# Patient Record
Sex: Female | Born: 1975 | Race: Black or African American | Hispanic: No | Marital: Married | Smoking: Never smoker
Health system: Southern US, Community
[De-identification: ages and names within clinical notes are randomized; demographics above are authoritative.]

## PROBLEM LIST (undated history)

## (undated) DIAGNOSIS — R51 Headache: Secondary | ICD-10-CM

## (undated) DIAGNOSIS — R519 Headache, unspecified: Secondary | ICD-10-CM

## (undated) DIAGNOSIS — J45909 Unspecified asthma, uncomplicated: Secondary | ICD-10-CM

## (undated) DIAGNOSIS — I10 Essential (primary) hypertension: Secondary | ICD-10-CM

## (undated) DIAGNOSIS — T7840XA Allergy, unspecified, initial encounter: Secondary | ICD-10-CM

## (undated) DIAGNOSIS — U071 COVID-19: Secondary | ICD-10-CM

## (undated) DIAGNOSIS — Z9289 Personal history of other medical treatment: Secondary | ICD-10-CM

## (undated) DIAGNOSIS — F32A Depression, unspecified: Secondary | ICD-10-CM

## (undated) DIAGNOSIS — G4730 Sleep apnea, unspecified: Secondary | ICD-10-CM

## (undated) DIAGNOSIS — B019 Varicella without complication: Secondary | ICD-10-CM

## (undated) DIAGNOSIS — E119 Type 2 diabetes mellitus without complications: Secondary | ICD-10-CM

## (undated) DIAGNOSIS — F329 Major depressive disorder, single episode, unspecified: Secondary | ICD-10-CM

## (undated) HISTORY — DX: Varicella without complication: B01.9

## (undated) HISTORY — PX: SEPTOPLASTY: SUR1290

## (undated) HISTORY — PX: WISDOM TOOTH EXTRACTION: SHX21

## (undated) HISTORY — DX: Unspecified asthma, uncomplicated: J45.909

## (undated) HISTORY — DX: Type 2 diabetes mellitus without complications: E11.9

## (undated) HISTORY — DX: Allergy, unspecified, initial encounter: T78.40XA

## (undated) HISTORY — DX: Headache: R51

## (undated) HISTORY — DX: Headache, unspecified: R51.9

---

## 2002-07-19 HISTORY — PX: BREAST BIOPSY: SHX20

## 2009-05-09 ENCOUNTER — Encounter: Admission: RE | Admit: 2009-05-09 | Discharge: 2009-05-09 | Payer: Self-pay | Admitting: Otolaryngology

## 2009-06-25 ENCOUNTER — Encounter: Admission: RE | Admit: 2009-06-25 | Discharge: 2009-07-16 | Payer: Self-pay | Admitting: Obstetrics and Gynecology

## 2009-07-14 ENCOUNTER — Inpatient Hospital Stay (HOSPITAL_COMMUNITY): Admission: AD | Admit: 2009-07-14 | Discharge: 2009-07-25 | Payer: Self-pay | Admitting: Obstetrics and Gynecology

## 2009-07-15 ENCOUNTER — Other Ambulatory Visit: Payer: Self-pay | Admitting: Obstetrics and Gynecology

## 2009-07-18 ENCOUNTER — Encounter (INDEPENDENT_AMBULATORY_CARE_PROVIDER_SITE_OTHER): Payer: Self-pay | Admitting: Obstetrics and Gynecology

## 2009-07-19 DIAGNOSIS — Z9289 Personal history of other medical treatment: Secondary | ICD-10-CM

## 2009-07-19 HISTORY — DX: Personal history of other medical treatment: Z92.89

## 2009-07-25 ENCOUNTER — Inpatient Hospital Stay (HOSPITAL_COMMUNITY): Admission: AD | Admit: 2009-07-25 | Discharge: 2009-07-25 | Payer: Self-pay | Admitting: Obstetrics and Gynecology

## 2010-07-03 ENCOUNTER — Ambulatory Visit: Payer: Self-pay | Admitting: Family Medicine

## 2010-07-22 DIAGNOSIS — J309 Allergic rhinitis, unspecified: Secondary | ICD-10-CM | POA: Insufficient documentation

## 2010-07-22 DIAGNOSIS — N3941 Urge incontinence: Secondary | ICD-10-CM | POA: Insufficient documentation

## 2010-07-22 DIAGNOSIS — R51 Headache: Secondary | ICD-10-CM

## 2010-07-22 DIAGNOSIS — R519 Headache, unspecified: Secondary | ICD-10-CM | POA: Insufficient documentation

## 2010-07-22 DIAGNOSIS — J45909 Unspecified asthma, uncomplicated: Secondary | ICD-10-CM | POA: Insufficient documentation

## 2010-07-22 LAB — CONVERTED CEMR LAB
Bilirubin Urine: NEGATIVE
Blood in Urine, dipstick: NEGATIVE
Glucose, Urine, Semiquant: NEGATIVE
Ketones, urine, test strip: NEGATIVE
Nitrite: NEGATIVE
Protein, U semiquant: 30
Specific Gravity, Urine: 1.025
Urobilinogen, UA: 0.2
WBC Urine, dipstick: NEGATIVE
pH: 5

## 2010-08-09 ENCOUNTER — Encounter: Payer: Self-pay | Admitting: Obstetrics and Gynecology

## 2010-08-19 ENCOUNTER — Encounter: Payer: Self-pay | Admitting: Family Medicine

## 2010-08-20 NOTE — Assessment & Plan Note (Signed)
Summary: NEW PT TO EST/CLE   Vital Signs:  Patient profile:   35 year old female Height:      63.25 inches Weight:      194 pounds BMI:     34.22 Temp:     98.0 degrees F oral Pulse rate:   67 / minute Pulse rhythm:   regular BP sitting:   130 / 90  (left arm) Cuff size:   regular  Vitals Entered By: Linde Gillis CMA (AAMA) (July 22, 2010 10:00 AM) CC: new patient, establish care   History of Present Illness: 35 yo here to [place] care.  1.  Urge incontinence- has been ongoing for over a year, getting gradually worse.  Started even before her pregnancy with her 36 year old.  She is a G1P1 s/p C/s.  Gets up several times per night but often cannot make it to the bathroom in middle of day either. No dysuria or hematuria. Not worsened by coughing, sneezing, laughing. no pain with intercourse.  2.  Migraine headaches- has had sinus issues for years.  Followed by ENT at [facility].  She had Septoplasty and scope done recently and told her sinuses are now clear.  Still having headaches.  Usually unilateral.  Associated with nausea and photophobia.  Having 5-6 per month.  Wants to go in dark room when they occur.  Preventive Screening-Counseling & Management  Alcohol-Tobacco     Smoking Status: never      Drug Use:  no.    Current Medications (verified): 1)  Butalbital-Apap-Caffeine 50-325-40 Mg Tabs (Butalbital-Apap-Caffeine) .... Take One Tablet By Mouth Daily 2)  Topiramate 25 Mg Tabs (Topiramate) .Marland Kitchen.. 1 Tab By Mouth Qhs  Allergies (verified): No Known Drug Allergies  Past History:  Past Medical History: Allergic rhinitis Asthma G1P1- Dr. Cherly Hensen is OBGYN urinary incontinence OSA  Past Surgical History: Septoplasty Dec 24, 2004  Family History: Dad died at 57 of Gulf War Syndrome Family History of CAD Female 1st degree relative <60- gets yearly mammograms, [facility]. Dr. Cherly Hensen is OBGYN.    Social History: Married One daughter, 37 yo step son. Married.    Never Smoked Alcohol use-yes Drug use-no Smoking Status:  never Drug Use:  no  Review of Systems      See HPI General:  Denies malaise. Eyes:  Denies blurring. ENT:  Denies difficulty swallowing. CV:  Denies chest pain or discomfort. Resp:  Denies shortness of breath. GI:  Denies abdominal pain, bloody stools, and change in bowel habits. GU:  Complains of incontinence; denies dysuria, urinary frequency, and urinary hesitancy. MS:  Denies joint pain, joint redness, and joint swelling. Derm:  Denies rash. Neuro:  Complains of headaches; denies sensation of room spinning, tingling, tremors, visual disturbances, and weakness. Psych:  Denies anxiety and depression. Endo:  Denies cold intolerance and heat intolerance. Heme:  Denies abnormal bruising and bleeding. Allergy:  Complains of seasonal allergies and sneezing.  Physical Exam  General:  alert and overweight-appearing.   Head:  normocephalic and atraumatic.   Eyes:  vision grossly intact, pupils equal, and pupils round.   Ears:  R ear normal and L ear normal.   Nose:  no external deformity.   Mouth:  good dentition.   Neck:  No deformities, masses, or tenderness noted. Lungs:  Normal respiratory effort, chest expands symmetrically. Lungs are clear to auscultation, no crackles or wheezes. Heart:  Normal rate and regular rhythm. S1 and S2 normal without gallop, murmur, click, rub or other extra sounds. Abdomen:  Bowel sounds positive,abdomen soft and non-tender without masses, organomegaly or hernias noted. Msk:  No deformity or scoliosis noted of thoracic or lumbar spine.   Extremities:  no edema Neurologic:  alert & oriented X3, gait normal, and DTRs symmetrical and normal.   Skin:  Intact without suspicious lesions or rashes Psych:  Cognition and judgment appear intact. Alert and cooperative with normal attention span and concentration. No apparent delusions, illusions, hallucinations   Impression &  Recommendations:  Problem # 1:  URINARY INCONTINENCE, URGE (ICD-788.31) Assessment New UA pos for protein only. Given age and duration of symtpoms, will refer to urology for urodynamics. ?IC Orders: Urology Referral (Urology) UA Dipstick w/o Micro (manual) (09811)  Problem # 2:  MIGRAINE HEADACHE (ICD-346.90) Assessment: New Given frequency of headaches, will start Topamax for prophylaxis.  Pt information handout given. Will have pt follow up in 1-2 months. Her updated medication list for this problem includes:    Butalbital-apap-caffeine 50-325-40 Mg Tabs (Butalbital-apap-caffeine) .Marland Kitchen... Take one tablet by mouth daily  Complete Medication List: 1)  Butalbital-apap-caffeine 50-325-40 Mg Tabs (Butalbital-apap-caffeine) .... Take one tablet by mouth daily 2)  Topiramate 25 Mg Tabs (Topiramate) .Marland Kitchen.. 1 tab by mouth qhs  Patient Instructions: 1)  Great to meet you. 2)  Please stop by to see Shirlee Limerick on your way out. 3)  Please make an appt to come see me in 1-2 months (fasting so we can check labs). Prescriptions: TOPIRAMATE 25 MG TABS (TOPIRAMATE) 1 tab by mouth qhs  #30 x 2   Entered and Authorized by:   Ruthe Mannan MD   Signed by:   Ruthe Mannan MD on 07/22/2010   Method used:   Print then Give to Patient   RxID:   [id number]    Orders Added: 1)  Urology Referral [Urology] 2)  UA Dipstick w/o Micro (manual) [81002] 3)  New Patient Level IV [78469]    Current Allergies (reviewed today): No known allergies   Laboratory Results   Urine Tests  Date/Time Received: July 22, 2010 10:22 AM  Routine Urinalysis   Color: yellow Appearance: Clear Glucose: negative   (Normal Range: Negative) Bilirubin: negative   (Normal Range: Negative) Ketone: negative   (Normal Range: Negative) Spec. Gravity: 1.025   (Normal Range: 1.003-1.035) Blood: negative   (Normal Range: Negative) pH: 5.0   (Normal Range: 5.0-8.0) Protein: 30   (Normal Range: Negative) Urobilinogen: 0.2    (Normal Range: 0-1) Nitrite: negative   (Normal Range: Negative) Leukocyte Esterace: negative   (Normal Range: Negative)

## 2010-09-03 NOTE — Consult Note (Signed)
Summary: [facility]  [facility]   Imported By: Maryln Gottron 08/27/2010 15:46:32  _____________________________________________________________________  External Attachment:    Type:   Image     Comment:   External Document  Appended Document: [facility]- Toviaz and Pelvic floor PT prescribed.

## 2010-09-22 ENCOUNTER — Ambulatory Visit: Payer: Self-pay | Admitting: Family Medicine

## 2010-09-22 DIAGNOSIS — Z0289 Encounter for other administrative examinations: Secondary | ICD-10-CM

## 2010-10-04 LAB — CROSSMATCH
ABO/RH(D): B POS
Antibody Screen: NEGATIVE

## 2010-10-04 LAB — CBC
HCT: 18.9 % — ABNORMAL LOW (ref 36.0–46.0)
HCT: 19.6 % — ABNORMAL LOW (ref 36.0–46.0)
HCT: 26.3 % — ABNORMAL LOW (ref 36.0–46.0)
HCT: 28.1 % — ABNORMAL LOW (ref 36.0–46.0)
HCT: 30.7 % — ABNORMAL LOW (ref 36.0–46.0)
Hemoglobin: 10.3 g/dL — ABNORMAL LOW (ref 12.0–15.0)
Hemoglobin: 6.2 g/dL — CL (ref 12.0–15.0)
Hemoglobin: 6.5 g/dL — CL (ref 12.0–15.0)
Hemoglobin: 8.8 g/dL — ABNORMAL LOW (ref 12.0–15.0)
Hemoglobin: 9.3 g/dL — ABNORMAL LOW (ref 12.0–15.0)
MCHC: 32.7 g/dL (ref 30.0–36.0)
MCHC: 33 g/dL (ref 30.0–36.0)
MCHC: 33.1 g/dL (ref 30.0–36.0)
MCHC: 33.5 g/dL (ref 30.0–36.0)
MCHC: 33.5 g/dL (ref 30.0–36.0)
MCV: 84.5 fL (ref 78.0–100.0)
MCV: 84.7 fL (ref 78.0–100.0)
MCV: 84.8 fL (ref 78.0–100.0)
MCV: 85.9 fL (ref 78.0–100.0)
MCV: 86.7 fL (ref 78.0–100.0)
Platelets: 284 10*3/uL (ref 150–400)
Platelets: 318 10*3/uL (ref 150–400)
Platelets: 340 10*3/uL (ref 150–400)
Platelets: 367 10*3/uL (ref 150–400)
Platelets: 378 10*3/uL (ref 150–400)
RBC: 2.23 MIL/uL — ABNORMAL LOW (ref 3.87–5.11)
RBC: 2.31 MIL/uL — ABNORMAL LOW (ref 3.87–5.11)
RBC: 3.07 MIL/uL — ABNORMAL LOW (ref 3.87–5.11)
RBC: 3.32 MIL/uL — ABNORMAL LOW (ref 3.87–5.11)
RBC: 3.54 MIL/uL — ABNORMAL LOW (ref 3.87–5.11)
RDW: 13.7 % (ref 11.5–15.5)
RDW: 13.8 % (ref 11.5–15.5)
RDW: 14 % (ref 11.5–15.5)
RDW: 14.4 % (ref 11.5–15.5)
RDW: 14.8 % (ref 11.5–15.5)
WBC: 19.1 10*3/uL — ABNORMAL HIGH (ref 4.0–10.5)
WBC: 19.2 10*3/uL — ABNORMAL HIGH (ref 4.0–10.5)
WBC: 20 10*3/uL — ABNORMAL HIGH (ref 4.0–10.5)
WBC: 20.3 10*3/uL — ABNORMAL HIGH (ref 4.0–10.5)
WBC: 24.4 10*3/uL — ABNORMAL HIGH (ref 4.0–10.5)

## 2010-10-04 LAB — SAMPLE TO BLOOD BANK

## 2010-10-04 LAB — ABO/RH: ABO/RH(D): B POS

## 2010-10-19 LAB — CBC
HCT: 31.4 % — ABNORMAL LOW (ref 36.0–46.0)
HCT: 32.5 % — ABNORMAL LOW (ref 36.0–46.0)
HCT: 32.7 % — ABNORMAL LOW (ref 36.0–46.0)
HCT: 33.1 % — ABNORMAL LOW (ref 36.0–46.0)
HCT: 33.8 % — ABNORMAL LOW (ref 36.0–46.0)
HCT: 34.9 % — ABNORMAL LOW (ref 36.0–46.0)
HCT: 35.5 % — ABNORMAL LOW (ref 36.0–46.0)
Hemoglobin: 10.5 g/dL — ABNORMAL LOW (ref 12.0–15.0)
Hemoglobin: 10.5 g/dL — ABNORMAL LOW (ref 12.0–15.0)
Hemoglobin: 10.8 g/dL — ABNORMAL LOW (ref 12.0–15.0)
Hemoglobin: 10.9 g/dL — ABNORMAL LOW (ref 12.0–15.0)
Hemoglobin: 11.3 g/dL — ABNORMAL LOW (ref 12.0–15.0)
Hemoglobin: 11.6 g/dL — ABNORMAL LOW (ref 12.0–15.0)
Hemoglobin: 11.7 g/dL — ABNORMAL LOW (ref 12.0–15.0)
MCHC: 32.2 g/dL (ref 30.0–36.0)
MCHC: 32.9 g/dL (ref 30.0–36.0)
MCHC: 32.9 g/dL (ref 30.0–36.0)
MCHC: 33 g/dL (ref 30.0–36.0)
MCHC: 33.2 g/dL (ref 30.0–36.0)
MCHC: 33.3 g/dL (ref 30.0–36.0)
MCHC: 33.3 g/dL (ref 30.0–36.0)
MCV: 82.9 fL (ref 78.0–100.0)
MCV: 83 fL (ref 78.0–100.0)
MCV: 83.3 fL (ref 78.0–100.0)
MCV: 83.7 fL (ref 78.0–100.0)
MCV: 83.8 fL (ref 78.0–100.0)
MCV: 84 fL (ref 78.0–100.0)
MCV: 84.4 fL (ref 78.0–100.0)
Platelets: 331 10*3/uL (ref 150–400)
Platelets: 346 10*3/uL (ref 150–400)
Platelets: 350 10*3/uL (ref 150–400)
Platelets: 373 10*3/uL (ref 150–400)
Platelets: 394 10*3/uL (ref 150–400)
Platelets: 401 10*3/uL — ABNORMAL HIGH (ref 150–400)
Platelets: 407 10*3/uL — ABNORMAL HIGH (ref 150–400)
RBC: 3.75 MIL/uL — ABNORMAL LOW (ref 3.87–5.11)
RBC: 3.87 MIL/uL (ref 3.87–5.11)
RBC: 3.93 MIL/uL (ref 3.87–5.11)
RBC: 3.94 MIL/uL (ref 3.87–5.11)
RBC: 4.04 MIL/uL (ref 3.87–5.11)
RBC: 4.19 MIL/uL (ref 3.87–5.11)
RBC: 4.28 MIL/uL (ref 3.87–5.11)
RDW: 13.1 % (ref 11.5–15.5)
RDW: 13.3 % (ref 11.5–15.5)
RDW: 13.3 % (ref 11.5–15.5)
RDW: 13.5 % (ref 11.5–15.5)
RDW: 13.6 % (ref 11.5–15.5)
RDW: 13.8 % (ref 11.5–15.5)
RDW: 13.9 % (ref 11.5–15.5)
WBC: 13.2 10*3/uL — ABNORMAL HIGH (ref 4.0–10.5)
WBC: 15.3 10*3/uL — ABNORMAL HIGH (ref 4.0–10.5)
WBC: 15.4 10*3/uL — ABNORMAL HIGH (ref 4.0–10.5)
WBC: 16.7 10*3/uL — ABNORMAL HIGH (ref 4.0–10.5)
WBC: 17.2 10*3/uL — ABNORMAL HIGH (ref 4.0–10.5)
WBC: 17.8 10*3/uL — ABNORMAL HIGH (ref 4.0–10.5)
WBC: 18.2 10*3/uL — ABNORMAL HIGH (ref 4.0–10.5)

## 2010-10-19 LAB — COMPREHENSIVE METABOLIC PANEL
ALT: 19 U/L (ref 0–35)
ALT: 20 U/L (ref 0–35)
ALT: 20 U/L (ref 0–35)
ALT: 20 U/L (ref 0–35)
ALT: 23 U/L (ref 0–35)
ALT: 23 U/L (ref 0–35)
ALT: 29 U/L (ref 0–35)
AST: 19 U/L (ref 0–37)
AST: 22 U/L (ref 0–37)
AST: 22 U/L (ref 0–37)
AST: 23 U/L (ref 0–37)
AST: 26 U/L (ref 0–37)
AST: 28 U/L (ref 0–37)
AST: 39 U/L — ABNORMAL HIGH (ref 0–37)
Albumin: 2 g/dL — ABNORMAL LOW (ref 3.5–5.2)
Albumin: 2 g/dL — ABNORMAL LOW (ref 3.5–5.2)
Albumin: 2.1 g/dL — ABNORMAL LOW (ref 3.5–5.2)
Albumin: 2.2 g/dL — ABNORMAL LOW (ref 3.5–5.2)
Albumin: 2.3 g/dL — ABNORMAL LOW (ref 3.5–5.2)
Albumin: 2.4 g/dL — ABNORMAL LOW (ref 3.5–5.2)
Albumin: 2.4 g/dL — ABNORMAL LOW (ref 3.5–5.2)
Alkaline Phosphatase: 114 U/L (ref 39–117)
Alkaline Phosphatase: 121 U/L — ABNORMAL HIGH (ref 39–117)
Alkaline Phosphatase: 121 U/L — ABNORMAL HIGH (ref 39–117)
Alkaline Phosphatase: 130 U/L — ABNORMAL HIGH (ref 39–117)
Alkaline Phosphatase: 134 U/L — ABNORMAL HIGH (ref 39–117)
Alkaline Phosphatase: 146 U/L — ABNORMAL HIGH (ref 39–117)
Alkaline Phosphatase: 147 U/L — ABNORMAL HIGH (ref 39–117)
BUN: 10 mg/dL (ref 6–23)
BUN: 12 mg/dL (ref 6–23)
BUN: 12 mg/dL (ref 6–23)
BUN: 13 mg/dL (ref 6–23)
BUN: 17 mg/dL (ref 6–23)
BUN: 19 mg/dL (ref 6–23)
BUN: 19 mg/dL (ref 6–23)
CO2: 19 mEq/L (ref 19–32)
CO2: 19 mEq/L (ref 19–32)
CO2: 19 mEq/L (ref 19–32)
CO2: 20 mEq/L (ref 19–32)
CO2: 22 mEq/L (ref 19–32)
CO2: 22 mEq/L (ref 19–32)
CO2: 22 mEq/L (ref 19–32)
Calcium: 7 mg/dL — ABNORMAL LOW (ref 8.4–10.5)
Calcium: 7.1 mg/dL — ABNORMAL LOW (ref 8.4–10.5)
Calcium: 7.1 mg/dL — ABNORMAL LOW (ref 8.4–10.5)
Calcium: 7.4 mg/dL — ABNORMAL LOW (ref 8.4–10.5)
Calcium: 7.7 mg/dL — ABNORMAL LOW (ref 8.4–10.5)
Calcium: 8 mg/dL — ABNORMAL LOW (ref 8.4–10.5)
Calcium: 8.6 mg/dL (ref 8.4–10.5)
Chloride: 103 mEq/L (ref 96–112)
Chloride: 103 mEq/L (ref 96–112)
Chloride: 103 mEq/L (ref 96–112)
Chloride: 104 mEq/L (ref 96–112)
Chloride: 104 mEq/L (ref 96–112)
Chloride: 105 mEq/L (ref 96–112)
Chloride: 107 mEq/L (ref 96–112)
Creatinine, Ser: 0.94 mg/dL (ref 0.4–1.2)
Creatinine, Ser: 0.99 mg/dL (ref 0.4–1.2)
Creatinine, Ser: 1.03 mg/dL (ref 0.4–1.2)
Creatinine, Ser: 1.08 mg/dL (ref 0.4–1.2)
Creatinine, Ser: 1.14 mg/dL (ref 0.4–1.2)
Creatinine, Ser: 1.16 mg/dL (ref 0.4–1.2)
Creatinine, Ser: 1.35 mg/dL — ABNORMAL HIGH (ref 0.4–1.2)
GFR calc Af Amer: 54 mL/min — ABNORMAL LOW (ref >60)
GFR calc Af Amer: >60 mL/min (ref >60)
GFR calc Af Amer: >60 mL/min (ref >60)
GFR calc Af Amer: >60 mL/min (ref >60)
GFR calc Af Amer: >60 mL/min (ref >60)
GFR calc Af Amer: >60 mL/min (ref >60)
GFR calc Af Amer: >60 mL/min (ref >60)
GFR calc non Af Amer: 45 mL/min — ABNORMAL LOW (ref >60)
GFR calc non Af Amer: 53 mL/min — ABNORMAL LOW (ref >60)
GFR calc non Af Amer: 55 mL/min — ABNORMAL LOW (ref >60)
GFR calc non Af Amer: 58 mL/min — ABNORMAL LOW (ref >60)
GFR calc non Af Amer: >60 mL/min (ref >60)
GFR calc non Af Amer: >60 mL/min (ref >60)
GFR calc non Af Amer: >60 mL/min (ref >60)
Glucose, Bld: 102 mg/dL — ABNORMAL HIGH (ref 70–99)
Glucose, Bld: 108 mg/dL — ABNORMAL HIGH (ref 70–99)
Glucose, Bld: 123 mg/dL — ABNORMAL HIGH (ref 70–99)
Glucose, Bld: 130 mg/dL — ABNORMAL HIGH (ref 70–99)
Glucose, Bld: 159 mg/dL — ABNORMAL HIGH (ref 70–99)
Glucose, Bld: 172 mg/dL — ABNORMAL HIGH (ref 70–99)
Glucose, Bld: 74 mg/dL (ref 70–99)
Potassium: 3.8 mEq/L (ref 3.5–5.1)
Potassium: 4.1 mEq/L (ref 3.5–5.1)
Potassium: 4.1 mEq/L (ref 3.5–5.1)
Potassium: 4.1 mEq/L (ref 3.5–5.1)
Potassium: 4.2 mEq/L (ref 3.5–5.1)
Potassium: 4.4 mEq/L (ref 3.5–5.1)
Potassium: 4.5 mEq/L (ref 3.5–5.1)
Sodium: 130 mEq/L — ABNORMAL LOW (ref 135–145)
Sodium: 130 mEq/L — ABNORMAL LOW (ref 135–145)
Sodium: 130 mEq/L — ABNORMAL LOW (ref 135–145)
Sodium: 131 mEq/L — ABNORMAL LOW (ref 135–145)
Sodium: 132 mEq/L — ABNORMAL LOW (ref 135–145)
Sodium: 133 mEq/L — ABNORMAL LOW (ref 135–145)
Sodium: 134 mEq/L — ABNORMAL LOW (ref 135–145)
Total Bilirubin: 0.3 mg/dL (ref 0.3–1.2)
Total Bilirubin: 0.3 mg/dL (ref 0.3–1.2)
Total Bilirubin: 0.3 mg/dL (ref 0.3–1.2)
Total Bilirubin: 0.4 mg/dL (ref 0.3–1.2)
Total Bilirubin: 0.4 mg/dL (ref 0.3–1.2)
Total Bilirubin: 0.4 mg/dL (ref 0.3–1.2)
Total Bilirubin: 0.6 mg/dL (ref 0.3–1.2)
Total Protein: 4.9 g/dL — ABNORMAL LOW (ref 6.0–8.3)
Total Protein: 5.2 g/dL — ABNORMAL LOW (ref 6.0–8.3)
Total Protein: 5.2 g/dL — ABNORMAL LOW (ref 6.0–8.3)
Total Protein: 5.4 g/dL — ABNORMAL LOW (ref 6.0–8.3)
Total Protein: 5.4 g/dL — ABNORMAL LOW (ref 6.0–8.3)
Total Protein: 5.7 g/dL — ABNORMAL LOW (ref 6.0–8.3)
Total Protein: 6 g/dL (ref 6.0–8.3)

## 2010-10-19 LAB — PROTEIN, URINE, 24 HOUR
Collection Interval-UPROT: 24 hours
Collection Interval-UPROT: 24 hours
Protein, 24H Urine: 1836 mg/d — ABNORMAL HIGH (ref 50–100)
Protein, 24H Urine: 2618 mg/d — ABNORMAL HIGH (ref 50–100)
Protein, Urine: 108 mg/dL
Protein, Urine: 238 mg/dL
Urine Total Volume-UPROT: 1100 mL
Urine Total Volume-UPROT: 1700 mL

## 2010-10-19 LAB — GLUCOSE, CAPILLARY
Glucose-Capillary: 106 mg/dL — ABNORMAL HIGH (ref 70–99)
Glucose-Capillary: 112 mg/dL — ABNORMAL HIGH (ref 70–99)
Glucose-Capillary: 115 mg/dL — ABNORMAL HIGH (ref 70–99)
Glucose-Capillary: 119 mg/dL — ABNORMAL HIGH (ref 70–99)
Glucose-Capillary: 124 mg/dL — ABNORMAL HIGH (ref 70–99)
Glucose-Capillary: 126 mg/dL — ABNORMAL HIGH (ref 70–99)
Glucose-Capillary: 128 mg/dL — ABNORMAL HIGH (ref 70–99)
Glucose-Capillary: 132 mg/dL — ABNORMAL HIGH (ref 70–99)
Glucose-Capillary: 138 mg/dL — ABNORMAL HIGH (ref 70–99)
Glucose-Capillary: 143 mg/dL — ABNORMAL HIGH (ref 70–99)
Glucose-Capillary: 148 mg/dL — ABNORMAL HIGH (ref 70–99)
Glucose-Capillary: 150 mg/dL — ABNORMAL HIGH (ref 70–99)
Glucose-Capillary: 157 mg/dL — ABNORMAL HIGH (ref 70–99)
Glucose-Capillary: 171 mg/dL — ABNORMAL HIGH (ref 70–99)
Glucose-Capillary: 99 mg/dL (ref 70–99)

## 2010-10-19 LAB — SAMPLE TO BLOOD BANK

## 2010-10-19 LAB — URINALYSIS, ROUTINE W REFLEX MICROSCOPIC
Bilirubin Urine: NEGATIVE
Glucose, UA: NEGATIVE mg/dL
Ketones, ur: NEGATIVE mg/dL
Leukocytes, UA: NEGATIVE
Nitrite: NEGATIVE
Protein, ur: 100 mg/dL — AB
Specific Gravity, Urine: 1.02 (ref 1.005–1.030)
Urobilinogen, UA: 0.2 mg/dL (ref 0.0–1.0)
pH: 6 (ref 5.0–8.0)

## 2010-10-19 LAB — CREATININE CLEARANCE, URINE, 24 HOUR
Collection Interval-CRCL: 24 hours
Collection Interval-CRCL: 24 hours
Creatinine Clearance: 110 mL/min (ref 75–115)
Creatinine Clearance: 114 mL/min (ref 75–115)
Creatinine, 24H Ur: 1494 mg/d (ref 700–1800)
Creatinine, 24H Ur: 1627 mg/d (ref 700–1800)
Creatinine, Urine: 135.8 mg/dL
Creatinine, Urine: 95.7 mg/dL
Creatinine: 0.94 mg/dL (ref 0.40–1.20)
Creatinine: 0.99 mg/dL (ref 0.40–1.20)
Urine Total Volume-CRCL: 1100 mL
Urine Total Volume-CRCL: 1700 mL

## 2010-10-19 LAB — MAGNESIUM
Magnesium: 5.2 mg/dL — ABNORMAL HIGH (ref 1.5–2.5)
Magnesium: 5.5 mg/dL — ABNORMAL HIGH (ref 1.5–2.5)
Magnesium: 5.6 mg/dL — ABNORMAL HIGH (ref 1.5–2.5)
Magnesium: 5.7 mg/dL — ABNORMAL HIGH (ref 1.5–2.5)
Magnesium: 5.9 mg/dL — ABNORMAL HIGH (ref 1.5–2.5)

## 2010-10-19 LAB — URIC ACID
Uric Acid, Serum: 6.2 mg/dL (ref 2.4–7.0)
Uric Acid, Serum: 6.4 mg/dL (ref 2.4–7.0)
Uric Acid, Serum: 7 mg/dL (ref 2.4–7.0)
Uric Acid, Serum: 7.1 mg/dL — ABNORMAL HIGH (ref 2.4–7.0)
Uric Acid, Serum: 7.5 mg/dL — ABNORMAL HIGH (ref 2.4–7.0)
Uric Acid, Serum: 7.6 mg/dL — ABNORMAL HIGH (ref 2.4–7.0)
Uric Acid, Serum: 7.7 mg/dL — ABNORMAL HIGH (ref 2.4–7.0)

## 2010-10-19 LAB — LACTATE DEHYDROGENASE
LDH: 207 U/L (ref 94–250)
LDH: 216 U/L (ref 94–250)
LDH: 219 U/L (ref 94–250)
LDH: 238 U/L (ref 94–250)
LDH: 242 U/L (ref 94–250)

## 2010-10-19 LAB — URINE MICROSCOPIC-ADD ON

## 2010-10-19 LAB — STREP B DNA PROBE: Strep Group B Ag: NEGATIVE

## 2010-10-19 LAB — URINE CULTURE: Colony Count: 15000

## 2010-10-19 LAB — RPR: RPR Ser Ql: NONREACTIVE

## 2011-01-07 ENCOUNTER — Other Ambulatory Visit: Payer: Self-pay | Admitting: Obstetrics and Gynecology

## 2011-12-18 LAB — HM PAP SMEAR: HM Pap smear: NORMAL

## 2012-12-17 LAB — HM MAMMOGRAPHY: HM Mammogram: NORMAL

## 2013-11-07 ENCOUNTER — Ambulatory Visit: Payer: Self-pay | Admitting: Otolaryngology

## 2013-12-12 ENCOUNTER — Ambulatory Visit: Payer: Self-pay | Admitting: Internal Medicine

## 2013-12-12 DIAGNOSIS — Z0289 Encounter for other administrative examinations: Secondary | ICD-10-CM

## 2013-12-17 ENCOUNTER — Encounter: Payer: Self-pay | Admitting: Internal Medicine

## 2013-12-17 ENCOUNTER — Ambulatory Visit (INDEPENDENT_AMBULATORY_CARE_PROVIDER_SITE_OTHER): Payer: 59 | Admitting: Internal Medicine

## 2013-12-17 ENCOUNTER — Ambulatory Visit: Payer: Self-pay | Admitting: Internal Medicine

## 2013-12-17 VITALS — BP 166/110 | HR 76 | Temp 98.1°F | Ht 63.25 in | Wt 204.0 lb

## 2013-12-17 DIAGNOSIS — E669 Obesity, unspecified: Secondary | ICD-10-CM | POA: Insufficient documentation

## 2013-12-17 DIAGNOSIS — F3289 Other specified depressive episodes: Secondary | ICD-10-CM

## 2013-12-17 DIAGNOSIS — F329 Major depressive disorder, single episode, unspecified: Secondary | ICD-10-CM

## 2013-12-17 DIAGNOSIS — F339 Major depressive disorder, recurrent, unspecified: Secondary | ICD-10-CM | POA: Insufficient documentation

## 2013-12-17 DIAGNOSIS — F32A Depression, unspecified: Secondary | ICD-10-CM

## 2013-12-17 DIAGNOSIS — F419 Anxiety disorder, unspecified: Secondary | ICD-10-CM | POA: Insufficient documentation

## 2013-12-17 DIAGNOSIS — Z0000 Encounter for general adult medical examination without abnormal findings: Secondary | ICD-10-CM

## 2013-12-17 LAB — COMPREHENSIVE METABOLIC PANEL
ALT: 14 U/L (ref 0–35)
AST: 15 U/L (ref 0–37)
Albumin: 3.5 g/dL (ref 3.5–5.2)
Alkaline Phosphatase: 88 U/L (ref 39–117)
BUN: 9 mg/dL (ref 6–23)
CO2: 28 mEq/L (ref 19–32)
Calcium: 8.7 mg/dL (ref 8.4–10.5)
Chloride: 103 mEq/L (ref 96–112)
Creatinine, Ser: 0.9 mg/dL (ref 0.4–1.2)
GFR: 95.71 mL/min (ref >60.00)
Glucose, Bld: 113 mg/dL — ABNORMAL HIGH (ref 70–99)
Potassium: 3.4 mEq/L — ABNORMAL LOW (ref 3.5–5.1)
Sodium: 139 mEq/L (ref 135–145)
Total Bilirubin: 0.7 mg/dL (ref 0.2–1.2)
Total Protein: 6.9 g/dL (ref 6.0–8.3)

## 2013-12-17 LAB — CBC
HCT: 40.8 % (ref 36.0–46.0)
Hemoglobin: 13.7 g/dL (ref 12.0–15.0)
MCHC: 33.5 g/dL (ref 30.0–36.0)
MCV: 83.9 fl (ref 78.0–100.0)
Platelets: 386 10*3/uL (ref 150.0–400.0)
RBC: 4.86 Mil/uL (ref 3.87–5.11)
RDW: 13.2 % (ref 11.5–15.5)
WBC: 9.4 10*3/uL (ref 4.0–10.5)

## 2013-12-17 LAB — LIPID PANEL
Cholesterol: 186 mg/dL (ref 0–200)
HDL: 61.4 mg/dL (ref >39.00)
LDL Cholesterol: 111 mg/dL — ABNORMAL HIGH (ref 0–99)
Total CHOL/HDL Ratio: 3
Triglycerides: 70 mg/dL (ref 0.0–149.0)
VLDL: 14 mg/dL (ref 0.0–40.0)

## 2013-12-17 LAB — HEMOGLOBIN A1C: Hgb A1c MFr Bld: 6.4 % (ref 4.6–6.5)

## 2013-12-17 LAB — TSH: TSH: 1.07 u[IU]/mL (ref 0.35–4.50)

## 2013-12-17 MED ORDER — SERTRALINE HCL 50 MG PO TABS: 50.0000 mg | ORAL_TABLET | Freq: Every day | ORAL | Status: DC

## 2013-12-17 NOTE — Patient Instructions (Addendum)

## 2013-12-17 NOTE — Progress Notes (Signed)
HPI  Pt presents to the clinic today to establish care. She has not had a PCP in 6 years. She does have some concerns today about depression. She feels very stressed at work and at home. She does occassionally have thoughts that she would be better off dead. She has no current SI, or come up with a plan of how she would go about it. She also reports she would never go through with it because she loves her family too much. She has had depression in the past that was well controlled on Zoloft.   Flu: 10/14 Tetanus: < 10 years ago LMP: amenorrhea- IUD Pap Smear: 2013- normal Mammogram: scheduled 12/2013 Dentist: [facility]  Past Medical History  Diagnosis Date  . Chicken pox   . Allergy   . Frequent headaches     Current Outpatient Prescriptions  Medication Sig Dispense Refill  . levonorgestrel (MIRENA) 20 MCG/24HR IUD 1 each by Intrauterine route once. 12/2009 insertion       No current facility-administered medications for this visit.    Allergies  Allergen Reactions  . Latex Hives  . Prednisone Swelling, Rash and Other (See Comments)    Elevated blood pressure    Family History  Problem Relation Age of Onset  . Hypertension Mother   . Arthritis Maternal Aunt   . Arthritis Maternal Grandmother   . Cancer Maternal Grandmother     Breast  . Hypertension Maternal Grandmother     History   Social History  . Marital Status: Married    Spouse Name: N/A    Number of Children: N/A  . Years of Education: N/A   Occupational History  . Not on file.   Social History Main Topics  . Smoking status: Never Smoker   . Smokeless tobacco: Never Used  . Alcohol Use: 0.6 oz/week    1 Glasses of wine per week     Comment: occasional wine  . Drug Use: No  . Sexual Activity: Yes   Other Topics Concern  . Not on file   Social History Narrative  . No narrative on file    ROS:  Constitutional: Pt reports weight gain. Denies fever, malaise, fatigue, headache.  HEENT: Denies  eye pain, eye redness, ear pain, ringing in the ears, wax buildup, runny nose, nasal congestion, bloody nose, or sore throat. Respiratory: Denies difficulty breathing, shortness of breath, cough or sputum production.   Cardiovascular: Denies chest pain, chest tightness, palpitations or swelling in the hands or feet.  Gastrointestinal: Denies abdominal pain, bloating, constipation, diarrhea or blood in the stool.  GU: Denies frequency, urgency, pain with urination, blood in urine, odor or discharge. Musculoskeletal: Pt reports left ankle pain and swelling secondary to fall 09/2013. Denies decrease in range of motion, difficulty with gait, muscle pain.  Skin: Denies redness, rashes, lesions or ulcercations.  Neurological: Denies dizziness, difficulty with memory, difficulty with speech or problems with balance and coordination.   No other specific complaints in a complete review of systems (except as listed in HPI above).  PE:  BP 166/110  Pulse 76  Temp(Src) 98.1 F (36.7 C) (Oral)  Ht 5' 3.25" (1.607 m)  Wt 204 lb (92.534 kg)  BMI 35.83 kg/m2  SpO2 98%  LMP 09/16/2009 Wt Readings from Last 3 Encounters:  12/17/13 204 lb (92.534 kg)  07/22/10 194 lb (87.998 kg)    General: Appears her stated age, obese but  well developed, well nourished in NAD. HEENT: Head: normal shape and size; Eyes: sclera  white, no icterus, conjunctiva pink, PERRLA and EOMs intact; Ears: Tm's gray and intact, normal light reflex; Nose: mucosa pink and moist, septum midline; Throat/Mouth: Teeth present, mucosa pink and moist, no lesions or ulcerations noted.  Neck: Normal range of motion. Neck supple, trachea midline. No massses, lumps or thyromegaly present.  Cardiovascular: Normal rate and rhythm. S1,S2 noted.  No murmur, rubs or gallops noted. No JVD or BLE edema. No carotid bruits noted. Pulmonary/Chest: Normal effort and positive vesicular breath sounds. No respiratory distress. No wheezes, rales or ronchi  noted.  Abdomen: Soft and nontender. Normal bowel sounds, no bruits noted. No distention or masses noted. Liver, spleen and kidneys non palpable. Musculoskeletal: Normal range of motion. No signs of joint swelling. No difficulty with gait.  Neurological: Alert and oriented. Cranial nerves grossly intact. Coordination normal. +DTRs bilaterally. Psychiatric: Mood and affect normal. Behavior is normal. Judgment and thought content normal.     BMET    Component Value Date/Time   NA 132* 07/18/2009 0537   K 4.4 07/18/2009 0537   CL 107 07/18/2009 0537   CO2 22 07/18/2009 0537   GLUCOSE 102* 07/18/2009 0537   BUN 19 07/18/2009 0537   CREATININE 1.08 07/18/2009 0537   CREATININE 0.99 07/17/2009 0841   CALCIUM 7.1* 07/18/2009 0537   GFRNONAA 58* 07/18/2009 0537   GFRAA  Value: >60        The eGFR has been calculated using the MDRD equation. This calculation has not been validated in all clinical situations. eGFR's persistently <60 mL/min signify possible Chronic Kidney Disease. 07/18/2009 0537    Lipid Panel  No results found for this basename: chol, trig, hdl, cholhdl, vldl, ldlcalc    CBC    Component Value Date/Time   WBC 19.2* 07/24/2009 0740   RBC 3.54* 07/24/2009 0740   HGB 10.3* 07/24/2009 0740   HCT 30.7* 07/24/2009 0740   PLT 378 07/24/2009 0740   MCV 86.7 07/24/2009 0740   MCHC 33.5 07/24/2009 0740   RDW 14.4 07/24/2009 0740    Hgb A1C No results found for this basename: HGBA1C     Assessment and Plan:  Preventative Health Maintenance:  Encouraged her to work on diet and exercise Will obtain screening labs today including A1C Pap due next year

## 2013-12-17 NOTE — Progress Notes (Signed)
Pre visit review using our clinic review tool, if applicable. No additional management support is needed unless otherwise documented below in the visit note. 

## 2013-12-17 NOTE — Assessment & Plan Note (Signed)
Will restart Zoloft Seems like she has a good support group at home  RTC in 6 weeks to f/u depression

## 2013-12-19 ENCOUNTER — Encounter: Payer: Self-pay | Admitting: Family Medicine

## 2014-01-28 ENCOUNTER — Encounter (INDEPENDENT_AMBULATORY_CARE_PROVIDER_SITE_OTHER): Payer: Self-pay

## 2014-01-28 ENCOUNTER — Telehealth: Payer: Self-pay | Admitting: Internal Medicine

## 2014-01-28 ENCOUNTER — Encounter: Payer: Self-pay | Admitting: Internal Medicine

## 2014-01-28 ENCOUNTER — Ambulatory Visit (INDEPENDENT_AMBULATORY_CARE_PROVIDER_SITE_OTHER): Payer: 59 | Admitting: Internal Medicine

## 2014-01-28 VITALS — BP 144/108 | HR 75 | Temp 97.9°F | Wt 201.0 lb

## 2014-01-28 DIAGNOSIS — F329 Major depressive disorder, single episode, unspecified: Secondary | ICD-10-CM

## 2014-01-28 DIAGNOSIS — F32A Depression, unspecified: Secondary | ICD-10-CM

## 2014-01-28 DIAGNOSIS — I10 Essential (primary) hypertension: Secondary | ICD-10-CM

## 2014-01-28 DIAGNOSIS — F3289 Other specified depressive episodes: Secondary | ICD-10-CM

## 2014-01-28 MED ORDER — HYDROCHLOROTHIAZIDE 12.5 MG PO CAPS: 12.5000 mg | ORAL_CAPSULE | Freq: Every day | ORAL | Status: DC

## 2014-01-28 NOTE — Assessment & Plan Note (Signed)
2 documented high blood pressure with history of high blood pressure in pregnancy Will start HCTZ 12.5 mg daily Encouraged her to continue to work on diet and exercise as weight loss will be beneficial  RTC in 1 month to recheck blood pressure

## 2014-01-28 NOTE — Progress Notes (Signed)
Subjective:    Patient ID: Martha Randall, female    DOB: [birth year], 38 y.o.   MRN: [id number]  HPI  Pt presents to the clinic today for 6 week follow up of depression. She restarted her zoloft 12/17/13. Since that time, she has been doing very well. She is tolerating the medication well without side effects. She has been working on her diet as well as exercising. She has lost 3 lbs.  I am a little concerned about her blood pressure. Today it is 144/108. At her last visit, it was 166/110. She denies headache, blurred vision, chest pain, chest tightness or shortness of breath. She did have high blood pressure with her pregnancy and was started on HCTZ shortly after she delivered. She did tolerate it well in the past without side effects.  Review of Systems      Past Medical History  Diagnosis Date  . Chicken pox   . Allergy   . Frequent headaches     Current Outpatient Prescriptions  Medication Sig Dispense Refill  . levonorgestrel (MIRENA) 20 MCG/24HR IUD 1 each by Intrauterine route once. 12/2009 insertion      . sertraline (ZOLOFT) 50 MG tablet Take 1 tablet (50 mg total) by mouth daily.  30 tablet  3   No current facility-administered medications for this visit.    Allergies  Allergen Reactions  . Latex Hives  . Prednisone Swelling, Rash and Other (See Comments)    Elevated blood pressure    Family History  Problem Relation Age of Onset  . Hypertension Mother   . Arthritis Maternal Aunt   . Arthritis Maternal Grandmother   . Cancer Maternal Grandmother     Breast  . Hypertension Maternal Grandmother     History   Social History  . Marital Status: Married    Spouse Name: N/A    Number of Children: N/A  . Years of Education: N/A   Occupational History  . Not on file.   Social History Main Topics  . Smoking status: Never Smoker   . Smokeless tobacco: Never Used  . Alcohol Use: 0.6 oz/week    1 Glasses of wine per week     Comment: occasional  wine  . Drug Use: No  . Sexual Activity: Yes   Other Topics Concern  . Not on file   Social History Narrative  . No narrative on file     Constitutional: Denies fever, malaise, fatigue, headache or abrupt weight changes.  Respiratory: Denies difficulty breathing, shortness of breath, cough or sputum production.   Cardiovascular: Denies chest pain, chest tightness, palpitations or swelling in the hands or feet.  Neurological: Denies dizziness, difficulty with memory, difficulty with speech or problems with balance and coordination.  Psych: Pt reports depression. Denies anxiety, SI/HI.  No other specific complaints in a complete review of systems (except as listed in HPI above).  Objective:   Physical Exam  BP 144/108  Pulse 75  Temp(Src) 97.9 F (36.6 C) (Oral)  Wt 201 lb (91.173 kg)  SpO2 98% Wt Readings from Last 3 Encounters:  01/28/14 201 lb (91.173 kg)  12/17/13 204 lb (92.534 kg)  07/22/10 194 lb (87.998 kg)    General: Appears her stated age, obese but well developed, well nourished in NAD. Cardiovascular: Normal rate and rhythm. S1,S2 noted.  No murmur, rubs or gallops noted. No JVD or BLE edema. No carotid bruits noted. Pulmonary/Chest: Normal effort and positive vesicular breath sounds. No respiratory distress. No wheezes,  rales or ronchi noted.  Psychiatric: Mood and affect normal. Behavior is normal. Judgment and thought content normal.     BMET    Component Value Date/Time   NA 139 12/17/2013 1122   K 3.4* 12/17/2013 1122   CL 103 12/17/2013 1122   CO2 28 12/17/2013 1122   GLUCOSE 113* 12/17/2013 1122   BUN 9 12/17/2013 1122   CREATININE 0.9 12/17/2013 1122   CREATININE 0.99 07/17/2009 0841   CALCIUM 8.7 12/17/2013 1122   GFRNONAA 58* 07/18/2009 0537   GFRAA  Value: >60        The eGFR has been calculated using the MDRD equation. This calculation has not been validated in all clinical situations. eGFR's persistently <60 mL/min signify possible Chronic Kidney  Disease. 07/18/2009 0537    Lipid Panel     Component Value Date/Time   CHOL 186 12/17/2013 1122   TRIG 70.0 12/17/2013 1122   HDL 61.40 12/17/2013 1122   CHOLHDL 3 12/17/2013 1122   VLDL 14.0 12/17/2013 1122   LDLCALC 111* 12/17/2013 1122    CBC    Component Value Date/Time   WBC 9.4 12/17/2013 1122   RBC 4.86 12/17/2013 1122   HGB 13.7 12/17/2013 1122   HCT 40.8 12/17/2013 1122   PLT 386.0 12/17/2013 1122   MCV 83.9 12/17/2013 1122   MCHC 33.5 12/17/2013 1122   RDW 13.2 12/17/2013 1122    Hgb A1C Lab Results  Component Value Date   HGBA1C 6.4 12/17/2013         Assessment & Plan:

## 2014-01-28 NOTE — Telephone Encounter (Signed)
Relevant patient education assigned to patient using Emmi. ° °

## 2014-01-28 NOTE — Progress Notes (Signed)
Pre visit review using our clinic review tool, if applicable. No additional management support is needed unless otherwise documented below in the visit note. 

## 2014-01-28 NOTE — Patient Instructions (Addendum)
Hypertension Hypertension, commonly called high blood pressure, is when the force of blood pumping through your arteries is too strong. Your arteries are the blood vessels that carry blood from your heart throughout your body. A blood pressure reading consists of a higher number over a lower number, such as 110/72. The higher number (systolic) is the pressure inside your arteries when your heart pumps. The lower number (diastolic) is the pressure inside your arteries when your heart relaxes. Ideally you want your blood pressure below 120/80. Hypertension forces your heart to work harder to pump blood. Your arteries may become narrow or stiff. Having hypertension puts you at risk for heart disease, stroke, and other problems.  RISK FACTORS Some risk factors for high blood pressure are controllable. Others are not.  Risk factors you cannot control include:   Race. You may be at higher risk if you are African American.  Age. Risk increases with age.  Gender. Men are at higher risk than women before age 45 years. After age 65, women are at higher risk than men. Risk factors you can control include:  Not getting enough exercise or physical activity.  Being overweight.  Getting too much fat, sugar, calories, or salt in your diet.  Drinking too much alcohol. SIGNS AND SYMPTOMS Hypertension does not usually cause signs or symptoms. Extremely high blood pressure (hypertensive crisis) may cause headache, anxiety, shortness of breath, and nosebleed. DIAGNOSIS  To check if you have hypertension, your health care provider will measure your blood pressure while you are seated, with your arm held at the level of your heart. It should be measured at least twice using the same arm. Certain conditions can cause a difference in blood pressure between your right and left arms. A blood pressure reading that is higher than normal on one occasion does not mean that you need treatment. If one blood pressure reading  is high, ask your health care provider about having it checked again. TREATMENT  Treating high blood pressure includes making lifestyle changes and possibly taking medication. Living a healthy lifestyle can help lower high blood pressure. You may need to change some of your habits. Lifestyle changes may include:  Following the DASH diet. This diet is high in fruits, vegetables, and whole grains. It is low in salt, red meat, and added sugars.  Getting at least 2 1/2 hours of brisk physical activity every week.  Losing weight if necessary.  Not smoking.  Limiting alcoholic beverages.  Learning ways to reduce stress. If lifestyle changes are not enough to get your blood pressure under control, your health care provider may prescribe medicine. You may need to take more than one. Work closely with your health care provider to understand the risks and benefits. HOME CARE INSTRUCTIONS  Have your blood pressure rechecked as directed by your health care provider.   Only take medicine as directed by your health care provider. Follow the directions carefully. Blood pressure medicines must be taken as prescribed. The medicine does not work as well when you skip doses. Skipping doses also puts you at risk for problems.   Do not smoke.   Monitor your blood pressure at home as directed by your health care provider. SEEK MEDICAL CARE IF:   You think you are having a reaction to medicines taken.  You have recurrent headaches or feel dizzy.  You have swelling in your ankles.  You have trouble with your vision. SEEK IMMEDIATE MEDICAL CARE IF:  You develop a severe headache or   confusion.  You have unusual weakness, numbness, or feel faint.  You have severe chest or abdominal pain.  You vomit repeatedly.  You have trouble breathing. MAKE SURE YOU:   Understand these instructions.  Will watch your condition.  Will get help right away if you are not doing well or get  worse. Document Released: 07/05/2005 Document Revised: 07/10/2013 Document Reviewed: 04/27/2013 Exit[employer] Patient Information 2015 [employer]. This information is not intended to replace advice given to you by your health care provider. Make sure you discuss any questions you have with your health care provider.  

## 2014-01-28 NOTE — Assessment & Plan Note (Signed)
Improved on Zoloft Will continue for now

## 2014-02-14 ENCOUNTER — Ambulatory Visit: Payer: Self-pay | Admitting: Otolaryngology

## 2014-02-14 LAB — CREATININE, SERUM
Creatinine: 0.94 mg/dL (ref 0.60–1.30)
EGFR (African American): >60
EGFR (Non-African Amer.): >60

## 2014-02-14 LAB — HCG, QUANTITATIVE, PREGNANCY: Beta Hcg, Quant.: <1 m[IU]/mL — ABNORMAL LOW

## 2014-02-28 ENCOUNTER — Ambulatory Visit (INDEPENDENT_AMBULATORY_CARE_PROVIDER_SITE_OTHER): Payer: 59 | Admitting: Internal Medicine

## 2014-02-28 ENCOUNTER — Encounter: Payer: Self-pay | Admitting: Internal Medicine

## 2014-02-28 VITALS — BP 138/98 | HR 77 | Temp 97.6°F | Wt 197.0 lb

## 2014-02-28 DIAGNOSIS — I10 Essential (primary) hypertension: Secondary | ICD-10-CM

## 2014-02-28 MED ORDER — HYDROCHLOROTHIAZIDE 25 MG PO TABS: 25.0000 mg | ORAL_TABLET | Freq: Every day | ORAL | Status: DC

## 2014-02-28 NOTE — Progress Notes (Signed)
Pre visit review using our clinic review tool, if applicable. No additional management support is needed unless otherwise documented below in the visit note. 

## 2014-02-28 NOTE — Patient Instructions (Addendum)

## 2014-02-28 NOTE — Progress Notes (Signed)
Subjective:    Patient ID: Martha Randall, female    DOB: 05/18/1976, 38 y.o.   MRN: [id number]  HPI  Pt presents to the clinic today to follow up HTN. At her last visit, she was started on HCTZ. Her blood pressure has gone from 144/108 to 138/98 today. She has noted some elevated blood pressure at home a few times last week. However, she reports that she is very stressed out with her job. She is tolerating the medication well without side effects. She has lost 4 lbs over the last month.  Review of Systems      Past Medical History  Diagnosis Date  . Chicken pox   . Allergy   . Frequent headaches     Current Outpatient Prescriptions  Medication Sig Dispense Refill  . hydrochlorothiazide (MICROZIDE) 12.5 MG capsule Take 1 capsule (12.5 mg total) by mouth daily.  30 capsule  0  . levonorgestrel (MIRENA) 20 MCG/24HR IUD 1 each by Intrauterine route once. 12/2009 insertion      . sertraline (ZOLOFT) 50 MG tablet Take 1 tablet (50 mg total) by mouth daily.  30 tablet  3   No current facility-administered medications for this visit.    Allergies  Allergen Reactions  . Latex Hives  . Prednisone Swelling, Rash and Other (See Comments)    Elevated blood pressure    Family History  Problem Relation Age of Onset  . Hypertension Mother   . Arthritis Maternal Aunt   . Arthritis Maternal Grandmother   . Cancer Maternal Grandmother     Breast  . Hypertension Maternal Grandmother     History   Social History  . Marital Status: Married    Spouse Name: N/A    Number of Children: N/A  . Years of Education: N/A   Occupational History  . Not on file.   Social History Main Topics  . Smoking status: Never Smoker   . Smokeless tobacco: Never Used  . Alcohol Use: 0.6 oz/week    1 Glasses of wine per week     Comment: occasional wine  . Drug Use: No  . Sexual Activity: Yes   Other Topics Concern  . Not on file   Social History Narrative  . No narrative on file      Constitutional: Denies fever, malaise, fatigue, headache or abrupt weight changes.  Respiratory: Denies difficulty breathing, shortness of breath, cough or sputum production.   Cardiovascular: Denies chest pain, chest tightness, palpitations or swelling in the hands or feet.  Neurological: Denies dizziness, difficulty with memory, difficulty with speech or problems with balance and coordination.   No other specific complaints in a complete review of systems (except as listed in HPI above).  Objective:   Physical Exam  BP 138/98  Pulse 77  Temp(Src) 97.6 F (36.4 C) (Oral)  Wt 197 lb (89.359 kg)  SpO2 99% Wt Readings from Last 3 Encounters:  02/28/14 197 lb (89.359 kg)  01/28/14 201 lb (91.173 kg)  12/17/13 204 lb (92.534 kg)    General: Appears her stated age, obese but well developed, well nourished in NAD. Cardiovascular: Normal rate and rhythm. S1,S2 noted.  No murmur, rubs or gallops noted. No JVD or BLE edema. No carotid bruits noted. Pulmonary/Chest: Normal effort and positive vesicular breath sounds. No respiratory distress. No wheezes, rales or ronchi noted.  Neurological: Alert and oriented.    BMET    Component Value Date/Time   NA 139 12/17/2013 1122   K  3.4* 12/17/2013 1122   CL 103 12/17/2013 1122   CO2 28 12/17/2013 1122   GLUCOSE 113* 12/17/2013 1122   BUN 9 12/17/2013 1122   CREATININE 0.9 12/17/2013 1122   CREATININE 0.99 07/17/2009 0841   CALCIUM 8.7 12/17/2013 1122   GFRNONAA 58* 07/18/2009 0537   GFRAA  Value: >60        The eGFR has been calculated using the MDRD equation. This calculation has not been validated in all clinical situations. eGFR's persistently <60 mL/min signify possible Chronic Kidney Disease. 07/18/2009 0537    Lipid Panel     Component Value Date/Time   CHOL 186 12/17/2013 1122   TRIG 70.0 12/17/2013 1122   HDL 61.40 12/17/2013 1122   CHOLHDL 3 12/17/2013 1122   VLDL 14.0 12/17/2013 1122   LDLCALC 111* 12/17/2013 1122    CBC    Component  Value Date/Time   WBC 9.4 12/17/2013 1122   RBC 4.86 12/17/2013 1122   HGB 13.7 12/17/2013 1122   HCT 40.8 12/17/2013 1122   PLT 386.0 12/17/2013 1122   MCV 83.9 12/17/2013 1122   MCHC 33.5 12/17/2013 1122   RDW 13.2 12/17/2013 1122    Hgb A1C Lab Results  Component Value Date   HGBA1C 6.4 12/17/2013         Assessment & Plan:

## 2014-02-28 NOTE — Assessment & Plan Note (Signed)
Better at HCTZ but not at goal Will increase to 25 mg  RTC in 1 month for BP check- will also get CMET at that time

## 2014-04-27 ENCOUNTER — Other Ambulatory Visit: Payer: Self-pay | Admitting: Internal Medicine

## 2014-05-22 ENCOUNTER — Other Ambulatory Visit: Payer: Self-pay

## 2014-05-24 NOTE — Telephone Encounter (Signed)
Pt left v/m requesting status of HCTZ refill; pt is out of med.Please advise.

## 2014-05-24 NOTE — Telephone Encounter (Signed)
Pt was to have a 1 mth f/u from

## 2014-05-24 NOTE — Telephone Encounter (Signed)
Potassium was borderline low needs to f/u i refilled last month stating pt needed an appt

## 2014-06-04 ENCOUNTER — Encounter: Payer: Self-pay | Admitting: Internal Medicine

## 2014-06-04 ENCOUNTER — Ambulatory Visit (INDEPENDENT_AMBULATORY_CARE_PROVIDER_SITE_OTHER): Payer: 59 | Admitting: Internal Medicine

## 2014-06-04 VITALS — BP 148/108 | HR 71 | Temp 98.1°F | Wt 200.0 lb

## 2014-06-04 DIAGNOSIS — F32A Depression, unspecified: Secondary | ICD-10-CM

## 2014-06-04 DIAGNOSIS — F329 Major depressive disorder, single episode, unspecified: Secondary | ICD-10-CM

## 2014-06-04 DIAGNOSIS — I10 Essential (primary) hypertension: Secondary | ICD-10-CM

## 2014-06-04 MED ORDER — HYDROCHLOROTHIAZIDE 25 MG PO TABS: ORAL_TABLET | ORAL | Status: DC

## 2014-06-04 NOTE — Assessment & Plan Note (Signed)
Well controlled on zoloft Refill sent to pharmacy

## 2014-06-04 NOTE — Progress Notes (Signed)
Pre visit review using our clinic review tool, if applicable. No additional management support is needed unless otherwise documented below in the visit note. 

## 2014-06-04 NOTE — Assessment & Plan Note (Signed)
BP elevated today because she has been out of her meds Will give refill today Discussed the importance of making 1 month follow up appt to recheck BP and CMET  RTC in 1 month

## 2014-06-04 NOTE — Progress Notes (Signed)
Subjective:    Patient ID: Martha Randall, female    DOB: 26-Jun-1976, 38 y.o.   MRN: [id number]  HPI  Pt presents to the clinic today for follow up of HTN. At her last visit 02/2014, her HCTZ was increased to 25 mg. She was advised to return in 1 month to recheck BP and repeat kidney function and potassium. She never showed up for her appt. She is in need of a refill of the medication today. She does reports that she has felt some minor chest pain in the last 2 weeks. She has been out of her medication for the last 3 weeks. She attributes the chest pain to stress and not heart related. She denies dizziness, shortness of breath or near syncope.  Additionally, she needs a refill of her zoloft. She reports it works well for her depression. She denies SI/HI.  Review of Systems      Past Medical History  Diagnosis Date  . Chicken pox   . Allergy   . Frequent headaches     Current Outpatient Prescriptions  Medication Sig Dispense Refill  . hydrochlorothiazide (HYDRODIURIL) 25 MG tablet TAKE 1 TABLET (25 MG TOTAL) BY MOUTH DAILY. 30 tablet 0  . levonorgestrel (MIRENA) 20 MCG/24HR IUD 1 each by Intrauterine route once. 12/2009 insertion    . sertraline (ZOLOFT) 50 MG tablet Take 1 tablet (50 mg total) by mouth daily. 30 tablet 3   No current facility-administered medications for this visit.    Allergies  Allergen Reactions  . Latex Hives  . Prednisone Swelling, Rash and Other (See Comments)    Elevated blood pressure    Family History  Problem Relation Age of Onset  . Hypertension Mother   . Arthritis Maternal Aunt   . Arthritis Maternal Grandmother   . Cancer Maternal Grandmother     Breast  . Hypertension Maternal Grandmother     History   Social History  . Marital Status: Married    Spouse Name: N/A    Number of Children: N/A  . Years of Education: N/A   Occupational History  . Not on file.   Social History Main Topics  . Smoking status: Never Smoker     . Smokeless tobacco: Never Used  . Alcohol Use: 0.6 oz/week    1 Glasses of wine per week     Comment: occasional wine  . Drug Use: No  . Sexual Activity: Yes   Other Topics Concern  . Not on file   Social History Narrative     Constitutional: Denies fever, malaise, fatigue, headache or abrupt weight changes.  Respiratory: Denies difficulty breathing, shortness of breath, cough or sputum production.   Cardiovascular: Pt reports chest pain.Denies chest tightness, palpitations or swelling in the hands or feet.   Neurological: Denies dizziness, difficulty with memory, difficulty with speech or problems with balance and coordination.  Psych: Pt reports depression. Denies anxiety, SI/HI.  No other specific complaints in a complete review of systems (except as listed in HPI above).  Objective:   Physical Exam   BP 148/108 mmHg  Pulse 71  Temp(Src) 98.1 F (36.7 C) (Oral)  Wt 200 lb (90.719 kg)  SpO2 98% Wt Readings from Last 3 Encounters:  06/04/14 200 lb (90.719 kg)  02/28/14 197 lb (89.359 kg)  01/28/14 201 lb (91.173 kg)    General: Appears her stated age, obese but well developed, well nourished in NAD. Cardiovascular: Normal rate and rhythm. S1,S2 noted.  No murmur, rubs  or gallops noted. No JVD or BLE edema. Pulmonary/Chest: Normal effort and positive vesicular breath sounds. No respiratory distress. No wheezes, rales or ronchi noted.  Psychiatric: Mood and affect normal. Behavior is normal. Judgment and thought content normal.     BMET    Component Value Date/Time   NA 139 12/17/2013 1122   K 3.4* 12/17/2013 1122   CL 103 12/17/2013 1122   CO2 28 12/17/2013 1122   GLUCOSE 113* 12/17/2013 1122   BUN 9 12/17/2013 1122   CREATININE 0.9 12/17/2013 1122   CREATININE 0.99 07/17/2009 0841   CALCIUM 8.7 12/17/2013 1122   GFRNONAA 58* 07/18/2009 0537   GFRAA  07/18/2009 0537    >60        The eGFR has been calculated using the MDRD equation. This calculation  has not been validated in all clinical situations. eGFR's persistently <60 mL/min signify possible Chronic Kidney Disease.    Lipid Panel     Component Value Date/Time   CHOL 186 12/17/2013 1122   TRIG 70.0 12/17/2013 1122   HDL 61.40 12/17/2013 1122   CHOLHDL 3 12/17/2013 1122   VLDL 14.0 12/17/2013 1122   LDLCALC 111* 12/17/2013 1122    CBC    Component Value Date/Time   WBC 9.4 12/17/2013 1122   RBC 4.86 12/17/2013 1122   HGB 13.7 12/17/2013 1122   HCT 40.8 12/17/2013 1122   PLT 386.0 12/17/2013 1122   MCV 83.9 12/17/2013 1122   MCHC 33.5 12/17/2013 1122   RDW 13.2 12/17/2013 1122    Hgb A1C Lab Results  Component Value Date   HGBA1C 6.4 12/17/2013        Assessment & Plan:   Flu shot today

## 2014-06-04 NOTE — Patient Instructions (Signed)

## 2014-07-04 ENCOUNTER — Ambulatory Visit (INDEPENDENT_AMBULATORY_CARE_PROVIDER_SITE_OTHER): Payer: 59 | Admitting: Internal Medicine

## 2014-07-04 ENCOUNTER — Encounter: Payer: Self-pay | Admitting: Internal Medicine

## 2014-07-04 VITALS — BP 140/90 | HR 71 | Temp 98.1°F | Wt 197.0 lb

## 2014-07-04 DIAGNOSIS — I10 Essential (primary) hypertension: Secondary | ICD-10-CM

## 2014-07-04 NOTE — Progress Notes (Signed)
Subjective:    Patient ID: Martha Randall, female    DOB: 1975/07/24, 38 y.o.   MRN: [id number]  HPI  Pt presents to the clinic today for 1 month follow up of HTN. Her BP was elevated at her last visit because she has ran out of her medication. A new RX for HCTZ 25 mg daily was sent in. I stressed the importance of her returning in 1 month to check her BP before she runs out of medication to see if she is at goal. I also wanted to check her kidney function and potassium level. She is tolerating the medication well without side effects. BP today 140/90.  Review of Systems      Past Medical History  Diagnosis Date  . Chicken pox   . Allergy   . Frequent headaches     Current Outpatient Prescriptions  Medication Sig Dispense Refill  . hydrochlorothiazide (HYDRODIURIL) 25 MG tablet TAKE 1 TABLET (25 MG TOTAL) BY MOUTH DAILY. 30 tablet 2  . levonorgestrel (MIRENA) 20 MCG/24HR IUD 1 each by Intrauterine route once. 12/2009 insertion    . sertraline (ZOLOFT) 50 MG tablet Take 1 tablet (50 mg total) by mouth daily. 30 tablet 3   No current facility-administered medications for this visit.    Allergies  Allergen Reactions  . Latex Hives  . Prednisone Swelling, Rash and Other (See Comments)    Elevated blood pressure    Family History  Problem Relation Age of Onset  . Hypertension Mother   . Arthritis Maternal Aunt   . Arthritis Maternal Grandmother   . Cancer Maternal Grandmother     Breast  . Hypertension Maternal Grandmother     History   Social History  . Marital Status: Married    Spouse Name: N/A    Number of Children: N/A  . Years of Education: N/A   Occupational History  . Not on file.   Social History Main Topics  . Smoking status: Never Smoker   . Smokeless tobacco: Never Used  . Alcohol Use: 0.6 oz/week    1 Glasses of wine per week     Comment: occasional wine  . Drug Use: No  . Sexual Activity: Yes   Other Topics Concern  . Not on file     Social History Narrative     Constitutional: Denies fever, malaise, fatigue, headache or abrupt weight changes.  Respiratory: Denies difficulty breathing, shortness of breath, cough or sputum production.   Cardiovascular: Denies chest pain, chest tightness, palpitations or swelling in the hands or feet.  Neurological: Denies dizziness, difficulty with memory, difficulty with speech or problems with balance and coordination.   No other specific complaints in a complete review of systems (except as listed in HPI above).   Objective:   Physical Exam   Wt 197 lb (89.359 kg) Wt Readings from Last 3 Encounters:  07/04/14 197 lb (89.359 kg)  06/04/14 200 lb (90.719 kg)  02/28/14 197 lb (89.359 kg)    General: Appears her stated age, well developed, well nourished in NAD. Cardiovascular: Normal rate and rhythm. S1,S2 noted.  No murmur, rubs or gallops noted. No JVD or BLE edema. No carotid bruits noted. Pulmonary/Chest: Normal effort and positive vesicular breath sounds. No respiratory distress. No wheezes, rales or ronchi noted.  Neurological: Alert and oriented.   BMET    Component Value Date/Time   NA 139 12/17/2013 1122   K 3.4* 12/17/2013 1122   CL 103 12/17/2013 1122  CO2 28 12/17/2013 1122   GLUCOSE 113* 12/17/2013 1122   BUN 9 12/17/2013 1122   CREATININE 0.9 12/17/2013 1122   CREATININE 0.99 07/17/2009 0841   CALCIUM 8.7 12/17/2013 1122   GFRNONAA 58* 07/18/2009 0537   GFRAA  07/18/2009 0537    >60        The eGFR has been calculated using the MDRD equation. This calculation has not been validated in all clinical situations. eGFR's persistently <60 mL/min signify possible Chronic Kidney Disease.    Lipid Panel     Component Value Date/Time   CHOL 186 12/17/2013 1122   TRIG 70.0 12/17/2013 1122   HDL 61.40 12/17/2013 1122   CHOLHDL 3 12/17/2013 1122   VLDL 14.0 12/17/2013 1122   LDLCALC 111* 12/17/2013 1122    CBC    Component Value Date/Time    WBC 9.4 12/17/2013 1122   RBC 4.86 12/17/2013 1122   HGB 13.7 12/17/2013 1122   HCT 40.8 12/17/2013 1122   PLT 386.0 12/17/2013 1122   MCV 83.9 12/17/2013 1122   MCHC 33.5 12/17/2013 1122   RDW 13.2 12/17/2013 1122    Hgb A1C Lab Results  Component Value Date   HGBA1C 6.4 12/17/2013        Assessment & Plan:

## 2014-07-04 NOTE — Assessment & Plan Note (Signed)
Better control Will check BMET today If needed will start potassium supplement  RTC in 6 months

## 2014-07-04 NOTE — Patient Instructions (Signed)

## 2014-07-04 NOTE — Progress Notes (Signed)
Pre visit review using our clinic review tool, if applicable. No additional management support is needed unless otherwise documented below in the visit note. 

## 2014-07-05 LAB — BASIC METABOLIC PANEL
BUN: 14 mg/dL (ref 6–23)
CO2: 30 mEq/L (ref 19–32)
Calcium: 9.1 mg/dL (ref 8.4–10.5)
Chloride: 99 mEq/L (ref 96–112)
Creatinine, Ser: 0.9 mg/dL (ref 0.4–1.2)
GFR: 89.35 mL/min (ref >60.00)
Glucose, Bld: 100 mg/dL — ABNORMAL HIGH (ref 70–99)
Potassium: 3.2 mEq/L — ABNORMAL LOW (ref 3.5–5.1)
Sodium: 137 mEq/L (ref 135–145)

## 2014-07-08 ENCOUNTER — Other Ambulatory Visit: Payer: Self-pay | Admitting: Internal Medicine

## 2014-07-08 DIAGNOSIS — E876 Hypokalemia: Secondary | ICD-10-CM

## 2014-07-08 MED ORDER — POTASSIUM CHLORIDE ER 10 MEQ PO TBCR: 10.0000 meq | EXTENDED_RELEASE_TABLET | Freq: Every day | ORAL | Status: DC

## 2014-08-15 ENCOUNTER — Other Ambulatory Visit (INDEPENDENT_AMBULATORY_CARE_PROVIDER_SITE_OTHER): Payer: 59

## 2014-08-15 DIAGNOSIS — E876 Hypokalemia: Secondary | ICD-10-CM

## 2014-08-15 LAB — POTASSIUM: Potassium: 3.3 mEq/L — ABNORMAL LOW (ref 3.5–5.1)

## 2014-08-19 MED ORDER — POTASSIUM CHLORIDE ER 10 MEQ PO TBCR: 10.0000 meq | EXTENDED_RELEASE_TABLET | Freq: Every day | ORAL | Status: DC

## 2014-08-19 NOTE — Addendum Note (Signed)
Addended by: Lurlean Nanny on: 08/19/2014 04:52 PM   Modules accepted: Orders

## 2014-08-27 ENCOUNTER — Other Ambulatory Visit: Payer: Self-pay | Admitting: Internal Medicine

## 2014-08-27 NOTE — Telephone Encounter (Signed)
Last filled 12/2013 with 3 refills--please advise

## 2014-09-02 ENCOUNTER — Ambulatory Visit: Payer: 59 | Admitting: Internal Medicine

## 2014-09-16 ENCOUNTER — Other Ambulatory Visit: Payer: Self-pay | Admitting: Obstetrics and Gynecology

## 2014-09-17 ENCOUNTER — Other Ambulatory Visit: Payer: Self-pay | Admitting: Obstetrics and Gynecology

## 2014-09-19 ENCOUNTER — Encounter (HOSPITAL_COMMUNITY)
Admission: RE | Admit: 2014-09-19 | Discharge: 2014-09-19 | Disposition: A | Discharge status: Home / Self Care | Payer: 59 | Source: Ambulatory Visit | Attending: Obstetrics and Gynecology | Admitting: Obstetrics and Gynecology

## 2014-09-19 ENCOUNTER — Encounter (HOSPITAL_COMMUNITY): Payer: Self-pay

## 2014-09-19 DIAGNOSIS — Z01818 Encounter for other preprocedural examination: Secondary | ICD-10-CM | POA: Insufficient documentation

## 2014-09-19 HISTORY — DX: Sleep apnea, unspecified: G47.30

## 2014-09-19 HISTORY — DX: Essential (primary) hypertension: I10

## 2014-09-19 HISTORY — DX: Major depressive disorder, single episode, unspecified: F32.9

## 2014-09-19 HISTORY — DX: Depression, unspecified: F32.A

## 2014-09-19 HISTORY — DX: Personal history of other medical treatment: Z92.89

## 2014-09-19 LAB — BASIC METABOLIC PANEL
Anion gap: 8 (ref 5–15)
BUN: 12 mg/dL (ref 6–23)
CO2: 30 mmol/L (ref 19–32)
Calcium: 8.8 mg/dL (ref 8.4–10.5)
Chloride: 102 mmol/L (ref 96–112)
Creatinine, Ser: 0.9 mg/dL (ref 0.50–1.10)
GFR calc Af Amer: >90 mL/min (ref >90)
GFR calc non Af Amer: 80 mL/min — ABNORMAL LOW (ref >90)
Glucose, Bld: 150 mg/dL — ABNORMAL HIGH (ref 70–99)
Potassium: 3.2 mmol/L — ABNORMAL LOW (ref 3.5–5.1)
Sodium: 140 mmol/L (ref 135–145)

## 2014-09-19 LAB — CBC
HCT: 40.1 % (ref 36.0–46.0)
Hemoglobin: 13.6 g/dL (ref 12.0–15.0)
MCH: 27.9 pg (ref 26.0–34.0)
MCHC: 33.9 g/dL (ref 30.0–36.0)
MCV: 82.3 fL (ref 78.0–100.0)
Platelets: 369 10*3/uL (ref 150–400)
RBC: 4.87 MIL/uL (ref 3.87–5.11)
RDW: 12.6 % (ref 11.5–15.5)
WBC: 8.4 10*3/uL (ref 4.0–10.5)

## 2014-09-19 NOTE — Patient Instructions (Addendum)
   Your procedure is scheduled on:  Thursday, March 10  Enter through the [employer] [facility] at:  1:15 PM Pick up the phone at the desk and dial [phone] and inform us of your arrival.  Please call this number if you have any problems the morning of surgery: [phone]  Remember: Do not eat food after midnight: Wednesday Do not drink clear liquids after: 10:30 AM Thursday, day of surgery Take these medicines the morning of surgery with a SIP OF WATER:  HTCZ and zoloft  Do not wear jewelry, make-up, or FINGER nail polish No metal in your hair or on your body. Do not wear lotions, powders, perfumes.  You may wear deodorant.  Do not bring valuables to the hospital. Contacts, dentures or bridgework may not be worn into surgery.  Patients discharged on the day of surgery will not be allowed to drive home.  Home with Husband "Von" cell [phone] and Mother Meredith Mody

## 2014-09-19 NOTE — Pre-Procedure Instructions (Signed)
SDS BB History Log given to Lab for patient's history of blood transfusion in 07/2009 at [facility], 2 units transfused.

## 2014-09-26 ENCOUNTER — Encounter (HOSPITAL_COMMUNITY): Payer: Self-pay | Admitting: Anesthesiology

## 2014-09-26 ENCOUNTER — Encounter (HOSPITAL_COMMUNITY): Payer: Self-pay | Admitting: *Deleted

## 2014-09-26 ENCOUNTER — Encounter (HOSPITAL_COMMUNITY)
Admission: RE | Disposition: A | Discharge status: Home / Self Care | Payer: Self-pay | Source: Ambulatory Visit | Attending: Obstetrics and Gynecology

## 2014-09-26 ENCOUNTER — Ambulatory Visit (HOSPITAL_COMMUNITY)
Admission: RE | Admit: 2014-09-26 | Discharge: 2014-09-26 | Disposition: A | Discharge status: Home / Self Care | Payer: 59 | Source: Ambulatory Visit | Attending: Obstetrics and Gynecology | Admitting: Obstetrics and Gynecology

## 2014-09-26 DIAGNOSIS — Z539 Procedure and treatment not carried out, unspecified reason: Secondary | ICD-10-CM | POA: Insufficient documentation

## 2014-09-26 HISTORY — PX: HYSTEROSCOPY WITH D & C: SHX1775

## 2014-09-26 LAB — PREGNANCY, URINE: Preg Test, Ur: NEGATIVE

## 2014-09-26 SURGERY — DILATATION AND CURETTAGE /HYSTEROSCOPY
Anesthesia: General

## 2014-09-26 MED ORDER — SCOPOLAMINE 1 MG/3DAYS TD PT72: 1.0000 | MEDICATED_PATCH | Freq: Once | TRANSDERMAL | Status: DC

## 2014-09-26 MED ORDER — LACTATED RINGERS IV SOLN: INTRAVENOUS | Status: DC

## 2014-09-26 SURGICAL SUPPLY — 17 items
CANISTER SUCT 3000ML (MISCELLANEOUS) ×1 IMPLANT
CATH ROBINSON RED A/P 16FR (CATHETERS) ×1 IMPLANT
CLOTH BEACON ORANGE TIMEOUT ST (SAFETY) ×1 IMPLANT
CONTAINER PREFILL 10% NBF 60ML (FORM) ×2 IMPLANT
ELECT REM PT RETURN 9FT ADLT (ELECTROSURGICAL)
ELECTRODE REM PT RTRN 9FT ADLT (ELECTROSURGICAL) ×1 IMPLANT
GLOVE BIOGEL PI IND STRL 7.0 (GLOVE) ×2 IMPLANT
GLOVE BIOGEL PI INDICATOR 7.0 (GLOVE)
GLOVE ECLIPSE 6.5 STRL STRAW (GLOVE) ×1 IMPLANT
GOWN STRL REUS W/TWL LRG LVL3 (GOWN DISPOSABLE) ×2 IMPLANT
LOOP ANGLED CUTTING 22FR (CUTTING LOOP) IMPLANT
PACK VAGINAL MINOR WOMEN LF (CUSTOM PROCEDURE TRAY) ×1 IMPLANT
PAD OB MATERNITY 4.3X12.25 (PERSONAL CARE ITEMS) ×1 IMPLANT
TOWEL OR 17X24 6PK STRL BLUE (TOWEL DISPOSABLE) ×2 IMPLANT
TUBING AQUILEX INFLOW (TUBING) ×1 IMPLANT
TUBING AQUILEX OUTFLOW (TUBING) ×1 IMPLANT
WATER STERILE IRR 1000ML POUR (IV SOLUTION) ×1 IMPLANT

## 2014-09-27 ENCOUNTER — Encounter (HOSPITAL_COMMUNITY): Payer: Self-pay | Admitting: Obstetrics and Gynecology

## 2014-09-27 ENCOUNTER — Other Ambulatory Visit: Payer: Self-pay | Admitting: Obstetrics and Gynecology

## 2014-09-30 ENCOUNTER — Other Ambulatory Visit: Payer: Self-pay | Admitting: Internal Medicine

## 2014-09-30 DIAGNOSIS — E876 Hypokalemia: Secondary | ICD-10-CM

## 2014-09-30 MED ORDER — POTASSIUM CHLORIDE CRYS ER 20 MEQ PO TBCR: 20.0000 meq | EXTENDED_RELEASE_TABLET | Freq: Two times a day (BID) | ORAL | Status: DC

## 2014-10-08 ENCOUNTER — Encounter (HOSPITAL_COMMUNITY)
Admission: RE | Disposition: A | Discharge status: Home / Self Care | Payer: Self-pay | Source: Ambulatory Visit | Attending: Obstetrics and Gynecology

## 2014-10-08 ENCOUNTER — Encounter (HOSPITAL_COMMUNITY): Payer: Self-pay | Admitting: Anesthesiology

## 2014-10-08 ENCOUNTER — Ambulatory Visit (HOSPITAL_COMMUNITY): Payer: 59 | Admitting: Anesthesiology

## 2014-10-08 ENCOUNTER — Ambulatory Visit (HOSPITAL_COMMUNITY)
Admission: RE | Admit: 2014-10-08 | Discharge: 2014-10-08 | Disposition: A | Discharge status: Home / Self Care | Payer: 59 | Source: Ambulatory Visit | Attending: Obstetrics and Gynecology | Admitting: Obstetrics and Gynecology

## 2014-10-08 DIAGNOSIS — I10 Essential (primary) hypertension: Secondary | ICD-10-CM | POA: Insufficient documentation

## 2014-10-08 DIAGNOSIS — J45909 Unspecified asthma, uncomplicated: Secondary | ICD-10-CM | POA: Insufficient documentation

## 2014-10-08 DIAGNOSIS — Z6835 Body mass index (BMI) 35.0-35.9, adult: Secondary | ICD-10-CM | POA: Diagnosis not present

## 2014-10-08 DIAGNOSIS — G4730 Sleep apnea, unspecified: Secondary | ICD-10-CM | POA: Diagnosis not present

## 2014-10-08 DIAGNOSIS — F329 Major depressive disorder, single episode, unspecified: Secondary | ICD-10-CM | POA: Insufficient documentation

## 2014-10-08 DIAGNOSIS — Z30432 Encounter for removal of intrauterine contraceptive device: Secondary | ICD-10-CM | POA: Insufficient documentation

## 2014-10-08 DIAGNOSIS — G43909 Migraine, unspecified, not intractable, without status migrainosus: Secondary | ICD-10-CM | POA: Insufficient documentation

## 2014-10-08 HISTORY — PX: HYSTEROSCOPY: SHX211

## 2014-10-08 LAB — BASIC METABOLIC PANEL
Anion gap: 8 (ref 5–15)
BUN: 14 mg/dL (ref 6–23)
CO2: 25 mmol/L (ref 19–32)
Calcium: 8.6 mg/dL (ref 8.4–10.5)
Chloride: 104 mmol/L (ref 96–112)
Creatinine, Ser: 0.69 mg/dL (ref 0.50–1.10)
GFR calc Af Amer: >90 mL/min (ref >90)
GFR calc non Af Amer: >90 mL/min (ref >90)
Glucose, Bld: 91 mg/dL (ref 70–99)
Potassium: 3.1 mmol/L — ABNORMAL LOW (ref 3.5–5.1)
Sodium: 137 mmol/L (ref 135–145)

## 2014-10-08 LAB — PREGNANCY, URINE: Preg Test, Ur: NEGATIVE

## 2014-10-08 LAB — CBC
HCT: 37.3 % (ref 36.0–46.0)
Hemoglobin: 12.7 g/dL (ref 12.0–15.0)
MCH: 28 pg (ref 26.0–34.0)
MCHC: 34 g/dL (ref 30.0–36.0)
MCV: 82.2 fL (ref 78.0–100.0)
Platelets: 349 10*3/uL (ref 150–400)
RBC: 4.54 MIL/uL (ref 3.87–5.11)
RDW: 12.7 % (ref 11.5–15.5)
WBC: 10.2 10*3/uL (ref 4.0–10.5)

## 2014-10-08 SURGERY — HYSTEROSCOPY
Anesthesia: General

## 2014-10-08 MED ORDER — IBUPROFEN 200 MG PO TABS: 400.0000 mg | ORAL_TABLET | Freq: Four times a day (QID) | ORAL | Status: DC | PRN

## 2014-10-08 MED ORDER — LIDOCAINE HCL (CARDIAC) 20 MG/ML IV SOLN
INTRAVENOUS | Status: DC | PRN
  Administered 2014-10-08: 80 mg via INTRAVENOUS

## 2014-10-08 MED ORDER — LACTATED RINGERS IV SOLN
INTRAVENOUS | Status: DC
Start: 2014-10-08 — End: 2014-10-08
  Administered 2014-10-08 (×3): via INTRAVENOUS

## 2014-10-08 MED ORDER — MIDAZOLAM HCL 2 MG/2ML IJ SOLN
INTRAMUSCULAR | Status: AC
  Filled 2014-10-08: qty 2

## 2014-10-08 MED ORDER — FENTANYL CITRATE 0.05 MG/ML IJ SOLN
INTRAMUSCULAR | Status: DC | PRN
  Administered 2014-10-08 (×3): 50 ug via INTRAVENOUS

## 2014-10-08 MED ORDER — ONDANSETRON HCL 4 MG/2ML IJ SOLN
INTRAMUSCULAR | Status: AC
  Filled 2014-10-08: qty 2

## 2014-10-08 MED ORDER — 0.9 % SODIUM CHLORIDE (POUR BTL) OPTIME
TOPICAL | Status: DC | PRN
  Administered 2014-10-08: 1000 mL

## 2014-10-08 MED ORDER — SCOPOLAMINE 1 MG/3DAYS TD PT72
1.0000 | MEDICATED_PATCH | Freq: Once | TRANSDERMAL | Status: DC
  Administered 2014-10-08: 1.5 mg via TRANSDERMAL

## 2014-10-08 MED ORDER — PROPOFOL 10 MG/ML IV BOLUS
INTRAVENOUS | Status: DC | PRN
  Administered 2014-10-08: 170 mg via INTRAVENOUS

## 2014-10-08 MED ORDER — LIDOCAINE HCL (CARDIAC) 20 MG/ML IV SOLN
INTRAVENOUS | Status: AC
  Filled 2014-10-08: qty 5

## 2014-10-08 MED ORDER — FENTANYL CITRATE 0.05 MG/ML IJ SOLN
INTRAMUSCULAR | Status: AC
  Filled 2014-10-08: qty 2

## 2014-10-08 MED ORDER — ONDANSETRON HCL 4 MG/2ML IJ SOLN
INTRAMUSCULAR | Status: DC | PRN
Start: 2014-10-08 — End: 2014-10-08
  Administered 2014-10-08: 4 mg via INTRAVENOUS

## 2014-10-08 MED ORDER — PROPOFOL 10 MG/ML IV BOLUS
INTRAVENOUS | Status: AC
  Filled 2014-10-08: qty 20

## 2014-10-08 MED ORDER — SCOPOLAMINE 1 MG/3DAYS TD PT72
MEDICATED_PATCH | TRANSDERMAL | Status: AC
  Filled 2014-10-08: qty 1

## 2014-10-08 MED ORDER — CHLOROPROCAINE HCL 1 % IJ SOLN
INTRAMUSCULAR | Status: AC
  Filled 2014-10-08: qty 30

## 2014-10-08 MED ORDER — CHLOROPROCAINE HCL 1 % IJ SOLN
INTRAMUSCULAR | Status: DC | PRN
  Administered 2014-10-08: 20 mL

## 2014-10-08 MED ORDER — MIDAZOLAM HCL 2 MG/2ML IJ SOLN
INTRAMUSCULAR | Status: DC | PRN
  Administered 2014-10-08: 2 mg via INTRAVENOUS

## 2014-10-08 SURGICAL SUPPLY — 20 items
ADH SKN CLS APL DERMABOND .7 (GAUZE/BANDAGES/DRESSINGS) ×1
CANISTER SUCT 3000ML (MISCELLANEOUS) ×2 IMPLANT
CATH ROBINSON RED A/P 16FR (CATHETERS) ×2 IMPLANT
CLOTH BEACON ORANGE TIMEOUT ST (SAFETY) ×2 IMPLANT
CONTAINER PREFILL 10% NBF 60ML (FORM) ×4 IMPLANT
DERMABOND ADVANCED (GAUZE/BANDAGES/DRESSINGS) ×1
DERMABOND ADVANCED .7 DNX12 (GAUZE/BANDAGES/DRESSINGS) IMPLANT
ELECT REM PT RETURN 9FT ADLT (ELECTROSURGICAL) ×2
ELECTRODE REM PT RTRN 9FT ADLT (ELECTROSURGICAL) ×1 IMPLANT
GLOVE BIOGEL PI IND STRL 7.0 (GLOVE) ×1 IMPLANT
GLOVE BIOGEL PI INDICATOR 7.0 (GLOVE) ×1
GLOVE ECLIPSE 6.5 STRL STRAW (GLOVE) ×2 IMPLANT
GOWN STRL REUS W/TWL LRG LVL3 (GOWN DISPOSABLE) ×6 IMPLANT
LOOP ANGLED CUTTING 22FR (CUTTING LOOP) IMPLANT
PACK VAGINAL MINOR WOMEN LF (CUSTOM PROCEDURE TRAY) ×2 IMPLANT
PAD OB MATERNITY 4.3X12.25 (PERSONAL CARE ITEMS) ×2 IMPLANT
TOWEL OR 17X24 6PK STRL BLUE (TOWEL DISPOSABLE) ×4 IMPLANT
TUBING AQUILEX INFLOW (TUBING) ×2 IMPLANT
TUBING AQUILEX OUTFLOW (TUBING) ×2 IMPLANT
WATER STERILE IRR 1000ML POUR (IV SOLUTION) ×2 IMPLANT

## 2014-10-08 NOTE — Brief Op Note (Signed)
10/08/2014  3:45 PM  PATIENT:  Martha Randall  39 y.o. female  PRE-OPERATIVE DIAGNOSIS:  Retained IUD, Nexplanon insertion  POST-OPERATIVE DIAGNOSIS:  Retained IUD,   Nexplanon insertion  PROCEDURE:  Exploration of uterine cavity, removal of IUD, Nexplanon insertion  SURGEON:  Surgeon(s) and Role:    * Servando Salina, MD - Primary  PHYSICIAN ASSISTANT:   ASSISTANTS: none   ANESTHESIA:   general  EBL:  Total I/O In: 1200 [I.V.:1200] Out: 205 [Urine:200; Blood:5]  BLOOD ADMINISTERED:none  DRAINS: none   LOCAL MEDICATIONS USED:  NONE  SPECIMEN:  No Specimen  DISPOSITION OF SPECIMEN:  N/A  COUNTS:  YES  TOURNIQUET:  * No tourniquets in log *  DICTATION: .Other Dictation: Dictation Number [id number]  PLAN OF CARE: Discharge to home after PACU  PATIENT DISPOSITION:  PACU - hemodynamically stable.   Delay start of Pharmacological VTE agent (>24hrs) due to surgical blood loss or risk of bleeding: no

## 2014-10-08 NOTE — Anesthesia Preprocedure Evaluation (Signed)
Anesthesia Evaluation  Patient identified by MRN, date of birth, ID band Patient awake    Reviewed: Allergy & Precautions, H&P , Patient's Chart, lab work & pertinent test results, reviewed documented beta blocker date and time   History of Anesthesia Complications Negative for: history of anesthetic complications  Airway Mallampati: III  TM Distance: >3 FB Neck ROM: full    Dental   Pulmonary asthma , sleep apnea and Continuous Positive Airway Pressure Ventilation ,  breath sounds clear to auscultation        Cardiovascular Exercise Tolerance: Good hypertension, Rhythm:regular Rate:Normal     Neuro/Psych  Headaches, negative psych ROS   GI/Hepatic   Endo/Other  Morbid obesity  Renal/GU      Musculoskeletal   Abdominal   Peds  Hematology   Anesthesia Other Findings   Reproductive/Obstetrics                             Anesthesia Physical Anesthesia Plan  ASA: III  Anesthesia Plan: General LMA   Post-op Pain Management:    Induction:   Airway Management Planned:   Additional Equipment:   Intra-op Plan:   Post-operative Plan:   Informed Consent: I have reviewed the patients History and Physical, chart, labs and discussed the procedure including the risks, benefits and alternatives for the proposed anesthesia with the patient or authorized representative who has indicated his/her understanding and acceptance.   Dental Advisory Given  Plan Discussed with: CRNA, Surgeon and Anesthesiologist  Anesthesia Plan Comments:         Anesthesia Quick Evaluation

## 2014-10-08 NOTE — H&P (Signed)
See scanned H&P  Pt was rescheduled due to eating prior to arrival I have reexamined pt . No change since last visit

## 2014-10-08 NOTE — Anesthesia Procedure Notes (Signed)
Procedure Name: LMA Insertion Date/Time: 10/08/2014 2:12 PM Performed by: Brock Ra Pre-anesthesia Checklist: Patient identified, Emergency Drugs available, Patient being monitored, Suction available and Timeout performed Patient Re-evaluated:Patient Re-evaluated prior to inductionOxygen Delivery Method: Circle system utilized Preoxygenation: Pre-oxygenation with 100% oxygen Intubation Type: IV induction Ventilation: Mask ventilation without difficulty LMA: LMA inserted LMA Size: 4.0 Grade View: Grade II Number of attempts: 1 Placement Confirmation: positive ETCO2 and breath sounds checked- equal and bilateral Dental Injury: Teeth and Oropharynx as per pre-operative assessment

## 2014-10-08 NOTE — Discharge Instructions (Signed)
°  Post Anesthesia Home Care Instructions  Activity: Get plenty of rest for the remainder of the day. A responsible adult should stay with you for 24 hours following the procedure.  For the next 24 hours, DO NOT: -Drive a car -Paediatric nurse -Drink alcoholic beverages -Take any medication unless instructed by your physician -Make any legal decisions or sign important papers.  Meals: Start with liquid foods such as gelatin or soup. Progress to regular foods as tolerated. Avoid greasy, spicy, heavy foods. If nausea and/or vomiting occur, drink only clear liquids until the nausea and/or vomiting subsides. Call your physician if vomiting continues.  Special Instructions/Symptoms: Your throat may feel dry or sore from the anesthesia or the breathing tube placed in your throat during surgery. If this causes discomfort, gargle with warm salt water. The discomfort should disappear within 24 hours.   DISCHARGE INSTRUCTIONS: HYSTEROSCOPY  The following instructions have been prepared to help you care for yourself upon your return    Personal hygiene  Shower the day after your procedure.   Wipe front to back after using the bathroom.  Activity and limitations:  Do NOT drive or operate any equipment for 24 hours. The effects of anesthesia are still present and drowsiness may result.  Do NOT rest in bed all day.  Walking is encouraged.  Walk up and down stairs slowly.  You may resume your normal activity in one to two days or as indicated by your physician.  Diet: Eat a light meal as desired this evening. You may resume your usual diet tomorrow.  Return to Work: You may resume your work activities in one to two days or as indicated by Marine scientist.  What to expect after your surgery: Expect to have vaginal bleeding/discharge for 2-3 days and spotting for up to 10 days. It is not unusual to have soreness for up to 1-2 weeks. You may have a slight burning sensation when you  urinate for the first day. Mild cramps may continue for a couple of days. You may have a regular period in 2-6 weeks.  Call your doctor for any of the following:  Excessive vaginal bleeding or clotting, saturating and changing one pad every hour.  Inability to urinate 6 hours after discharge from hospital.  Pain not relieved by pain medication.  Fever of 100.4 F or greater.  Unusual vaginal discharge or odor.  Return to office _________________Call for an appointment ___________________ Patients signature: ______________________ Nurses signature ________________________  [facility] [phone]

## 2014-10-08 NOTE — Transfer of Care (Signed)
Immediate Anesthesia Transfer of Care Note  Patient: Martha Randall  Procedure(s) Performed: Procedure(s) with comments: HYSTEROSCOPIC IUD Removal/Nexplanon Insertion (N/A) - 45 min.  Patient Location: PACU  Anesthesia Type:General  Level of Consciousness: awake, alert , oriented and patient cooperative  Airway & Oxygen Therapy: Patient Spontanous Breathing and Patient connected to nasal cannula oxygen  Post-op Assessment: Report given to RN and Post -op Vital signs reviewed and stable  Post vital signs: Reviewed and stable  Last Vitals:  Filed Vitals:   10/08/14 1247  BP: 159/99  Pulse: 77  Temp: 36.7 C  Resp: 18    Complications: No apparent anesthesia complications

## 2014-10-08 NOTE — Anesthesia Postprocedure Evaluation (Signed)
  Anesthesia Post-op Note  Patient: Martha Randall  Procedure(s) Performed: Procedure(s) with comments: HYSTEROSCOPIC IUD Removal/Nexplanon Insertion (N/A) - 45 min.  Patient Location: PACU  Anesthesia Type:General  Level of Consciousness: awake, alert  and oriented  Airway and Oxygen Therapy: Patient Spontanous Breathing  Post-op Pain: mild  Post-op Assessment: Post-op Vital signs reviewed, Patient's Cardiovascular Status Stable, Respiratory Function Stable, Patent Airway, No signs of Nausea or vomiting and Pain level controlled  Post-op Vital Signs: Reviewed and stable  Last Vitals:  Filed Vitals:   10/08/14 1545  BP: 175/119  Pulse: 83  Temp: 36.7 C  Resp: 20    Complications: No apparent anesthesia complications

## 2014-10-08 NOTE — OR Nursing (Signed)
Nexplanon implanted per Dr. Garwin Brothers in the right upper arm.  Dr. Garwin Brothers brought product from her office.  Exp. 11/2016, and lot # [id number].

## 2014-10-10 NOTE — Op Note (Signed)
Martha Randall, Martha Randall     ACCOUNT NO.:  [id number]  MEDICAL RECORD NO.:  [id number]  LOCATION:  [facility]                          FACILITY:  [facility]  PHYSICIAN:  Servando Salina, M.D.DATE OF BIRTH:  Dec 02, 1975  DATE OF PROCEDURE:  10/08/2014 DATE OF DISCHARGE:  10/08/2014                              OPERATIVE REPORT   PREOPERATIVE DIAGNOSIS:  Retained IUD, Nexplanon placement.  PROCEDURES: exploration of uterine cavity with removal of IUD, Nexplanon insertion.  POSTOPERATIVE DIAGNOSIS:  Retained IUD, Nexplanon insertion.  SURGEON:  Servando Salina, M.D.  ASSISTANT:  None.  DESCRIPTION OF PROCEDURE:  Under adequate general anesthesia, the patient was placed in the dorsal lithotomy position.  She was sterilely prepped and draped in usual fashion.  Bladder was catheterized for moderate amount of urine.  Examination under anesthesia revealed anteverted uterus.  No adnexal masses could be appreciated.  A bivalve speculum was placed in the vagina.  The IUD string was not seen.  A single-tooth tenaculum was placed on anterior lip of the cervix.  The cervix was then serially dilated to #[facility] dilator and then using a polyp forceps clamp, the uterine cavity was explored and ultimately the IUD string was found and removed.  At that point, all instruments were then removed from the vagina.  Attention was then turned to the patient's right arm, which was then flexed, measuring from the elbow and the upper arm to 12 cm.  The area for placement of the Nexplanon was prepped and the Nexplanon was inserted in the usual fashion.  The placement was easily palpable and the site was then closed with Dermabond.  A Band-Aid was then placed and the site was wrapped with gauze.  The patient tolerated the procedure well. Condition: stable ESTIMATED BLOOD LOSS:  Minimal. Specimen: none COMPLICATION:  None.  The patient was tolerated procedure well and was transferred to the recovery room  in stable condition.   Servando Salina, M.D.     [place]/MED[facility]  D:  10/09/2014  T:  10/10/2014  Job:  [id number]

## 2014-10-11 ENCOUNTER — Encounter (HOSPITAL_COMMUNITY): Payer: Self-pay | Admitting: Obstetrics and Gynecology

## 2015-01-03 ENCOUNTER — Ambulatory Visit: Payer: 59 | Admitting: Internal Medicine

## 2015-01-17 ENCOUNTER — Ambulatory Visit: Payer: 59 | Admitting: Internal Medicine

## 2015-01-17 ENCOUNTER — Encounter: Payer: Self-pay | Admitting: Internal Medicine

## 2015-01-17 ENCOUNTER — Ambulatory Visit (INDEPENDENT_AMBULATORY_CARE_PROVIDER_SITE_OTHER): Payer: 59 | Admitting: Internal Medicine

## 2015-01-17 VITALS — BP 128/94 | HR 75 | Temp 97.5°F | Wt 201.0 lb

## 2015-01-17 DIAGNOSIS — J4520 Mild intermittent asthma, uncomplicated: Secondary | ICD-10-CM | POA: Diagnosis not present

## 2015-01-17 DIAGNOSIS — F329 Major depressive disorder, single episode, unspecified: Secondary | ICD-10-CM

## 2015-01-17 DIAGNOSIS — G4733 Obstructive sleep apnea (adult) (pediatric): Secondary | ICD-10-CM

## 2015-01-17 DIAGNOSIS — J302 Other seasonal allergic rhinitis: Secondary | ICD-10-CM

## 2015-01-17 DIAGNOSIS — F32A Depression, unspecified: Secondary | ICD-10-CM

## 2015-01-17 DIAGNOSIS — I10 Essential (primary) hypertension: Secondary | ICD-10-CM

## 2015-01-17 DIAGNOSIS — E669 Obesity, unspecified: Secondary | ICD-10-CM

## 2015-01-17 DIAGNOSIS — G43819 Other migraine, intractable, without status migrainosus: Secondary | ICD-10-CM

## 2015-01-17 MED ORDER — BUTALBITAL-APAP-CAFFEINE 50-300-40 MG PO CAPS: 1.0000 | ORAL_CAPSULE | Freq: Every day | ORAL | Status: AC | PRN

## 2015-01-17 NOTE — Assessment & Plan Note (Signed)
Continue CPAP nightly. °

## 2015-01-17 NOTE — Assessment & Plan Note (Signed)
No recent flares Will continue to monitor

## 2015-01-17 NOTE — Progress Notes (Signed)
Subjective:    Patient ID: Martha Randall, female    DOB: [birth year], 39 y.o.   MRN: [id number]  HPI  Pt presents to the clinic today for 6 month follow up of chronic conditions.  HTN: BP well controlled on HCTZ. She does take a potassium supplement. Her BP today is 128/92. She has noticed that her BP was elevated yesterday, and is not sure if it is related to the Prednisone that she is taking, prescribed by her ENT doctor. She denies chest pain, dizziness or shortness of breath.  Allergic Rhinitis: Uses Flonase/Afrin when it flares.  Asthma and OSA: Breathing is going well. She uses her CPAP at night. She does not use inhalers.  Depression: Controlled on Zoloft. She denies SI/HI.  Migraine: Most recent one was this past Sunday. She thinks it is related to sinus pressure. She tried Afrin without relief. She used hot rice pack without relief. She also tried alka seltzer sinus without relief. She called her ENT who put her on Prednisone. She does report that she has a migraine about once per week. They are located in her forehead. She does have some sensitivity to light and sound but denies nausea and vomiting. She reports the Advil is not helping it and she wants to know if there is anything else she can take.   Review of Systems      Past Medical History  Diagnosis Date  . Chicken pox   . Allergy   . Hypertension   . Sleep apnea     uses cpap nightly  . Depression   . Frequent headaches     otc med prn  . History of blood transfusion 07/2009    [facility] - 2 units transfused    Current Outpatient Prescriptions  Medication Sig Dispense Refill  . fluticasone (FLONASE) 50 MCG/ACT nasal spray Place 1 spray into both nostrils daily as needed for allergies.   0  . hydrochlorothiazide (HYDRODIURIL) 25 MG tablet Take 1 tablet (25 mg total) by mouth daily. 30 tablet 2  . ibuprofen (ADVIL,MOTRIN) 200 MG tablet Take 2 tablets (400 mg total) by mouth every 6 (six) hours as needed for  moderate pain. 30 tablet 0  . levonorgestrel (MIRENA) 20 MCG/24HR IUD 1 each by Intrauterine route once. 12/2009 insertion    . oxymetazoline (AFRIN) 0.05 % nasal spray Place 2 sprays into both nostrils 2 (two) times daily as needed for congestion.    . potassium chloride SA (K-DUR,KLOR-CON) 20 MEQ tablet Take 1 tablet (20 mEq total) by mouth 2 (two) times daily. 60 tablet 2  . sertraline (ZOLOFT) 50 MG tablet TAKE 1 TABLET (50 MG TOTAL) BY MOUTH DAILY. 30 tablet 3   No current facility-administered medications for this visit.    Allergies  Allergen Reactions  . Latex Hives  . Prednisone Swelling, Rash and Other (See Comments)    Elevated blood pressure    Family History  Problem Relation Age of Onset  . Hypertension Mother   . Arthritis Maternal Aunt   . Arthritis Maternal Grandmother   . Cancer Maternal Grandmother     Breast  . Hypertension Maternal Grandmother     History   Social History  . Marital Status: Married    Spouse Name: N/A  . Number of Children: N/A  . Years of Education: N/A   Occupational History  . Not on file.   Social History Main Topics  . Smoking status: Never Smoker   . Smokeless tobacco: Never  Used  . Alcohol Use: 0.6 oz/week    1 Glasses of wine per week     Comment: occasional wine  . Drug Use: No  . Sexual Activity: Yes    Birth Control/ Protection: IUD   Other Topics Concern  . Not on file   Social History Narrative     Constitutional: Denies fever, malaise, fatigue, headache or abrupt weight changes.  HEENT: Denies eye pain, eye redness, ear pain, ringing in the ears, wax buildup, runny nose, nasal congestion, bloody nose, or sore throat. Respiratory: Denies difficulty breathing, shortness of breath, cough or sputum production.   Cardiovascular: Denies chest pain, chest tightness, palpitations or swelling in the hands or feet.  Skin: Denies redness, rashes, lesions or ulcercations.  Neurological: Denies dizziness, difficulty with  memory, difficulty with speech or problems with balance and coordination.  Psych: Pt reports depression. Denies anxiety, SI/HI.  No other specific complaints in a complete review of systems (except as listed in HPI above).  Objective:   Physical Exam   BP 128/94 mmHg  Pulse 75  Temp(Src) 97.5 F (36.4 C) (Oral)  Wt 201 lb (91.173 kg)  SpO2 98% Wt Readings from Last 3 Encounters:  01/17/15 201 lb (91.173 kg)  09/26/14 197 lb (89.359 kg)  09/19/14 200 lb (90.719 kg)    General: Appears her stated age, obese in NAD. Skin: Warm, dry and intact. No rashes, lesions or ulcerations noted. HEENT: Head: normal shape and size; Eyes: sclera white, no icterus, conjunctiva pink, PERRLA and EOMs intact; Ears: Tm's gray and intact, normal light reflex; Nose: mucosa pink and moist, septum midline; Throat/Mouth: Teeth present, mucosa pink and moist, no exudate, lesions or ulcerations noted.   Cardiovascular: Normal rate and rhythm. S1,S2 noted.  No murmur, rubs or gallops noted.  Pulmonary/Chest: Normal effort and positive vesicular breath sounds. No respiratory distress. No wheezes, rales or ronchi noted.  Neurological: Alert and oriented.  Psychiatric: Mood and affect normal. Behavior is normal. Judgment and thought content normal.     BMET    Component Value Date/Time   NA 137 10/08/2014 1240   K 3.1* 10/08/2014 1240   CL 104 10/08/2014 1240   CO2 25 10/08/2014 1240   GLUCOSE 91 10/08/2014 1240   BUN 14 10/08/2014 1240   CREATININE 0.69 10/08/2014 1240   CREATININE 0.94 02/14/2014 1205   CREATININE 0.99 07/17/2009 0841   CALCIUM 8.6 10/08/2014 1240   GFRNONAA >90 10/08/2014 1240   GFRNONAA >60 02/14/2014 1205   GFRAA >90 10/08/2014 1240   GFRAA >60 02/14/2014 1205    Lipid Panel     Component Value Date/Time   CHOL 186 12/17/2013 1122   TRIG 70.0 12/17/2013 1122   HDL 61.40 12/17/2013 1122   CHOLHDL 3 12/17/2013 1122   VLDL 14.0 12/17/2013 1122   LDLCALC 111* 12/17/2013  1122    CBC    Component Value Date/Time   WBC 10.2 10/08/2014 1240   RBC 4.54 10/08/2014 1240   HGB 12.7 10/08/2014 1240   HCT 37.3 10/08/2014 1240   PLT 349 10/08/2014 1240   MCV 82.2 10/08/2014 1240   MCH 28.0 10/08/2014 1240   MCHC 34.0 10/08/2014 1240   RDW 12.7 10/08/2014 1240    Hgb A1C Lab Results  Component Value Date   HGBA1C 6.4 12/17/2013        Assessment & Plan:

## 2015-01-17 NOTE — Progress Notes (Signed)
Pre visit review using our clinic review tool, if applicable. No additional management support is needed unless otherwise documented below in the visit note. 

## 2015-01-17 NOTE — Assessment & Plan Note (Signed)
Stop prednisone as it is causing elevated blood pressure Will give RX for Fioricet to see if this helps

## 2015-01-17 NOTE — Assessment & Plan Note (Signed)
Stable on Zoloft Will check CMET today

## 2015-01-17 NOTE — Patient Instructions (Signed)

## 2015-01-17 NOTE — Assessment & Plan Note (Addendum)
Well controlled on HCTZ and potassium supplement Will check CBC and CMET today

## 2015-01-17 NOTE — Assessment & Plan Note (Signed)
Continue Flonase and Afrin prn

## 2015-01-17 NOTE — Assessment & Plan Note (Signed)
Encouraged her to work on diet and exercise 

## 2015-01-18 LAB — COMPREHENSIVE METABOLIC PANEL
ALT: 15 U/L (ref 0–35)
AST: 13 U/L (ref 0–37)
Albumin: 3.7 g/dL (ref 3.5–5.2)
Alkaline Phosphatase: 82 U/L (ref 39–117)
BUN: 15 mg/dL (ref 6–23)
CO2: 31 mEq/L (ref 19–32)
Calcium: 8.9 mg/dL (ref 8.4–10.5)
Chloride: 99 mEq/L (ref 96–112)
Creat: 0.93 mg/dL (ref 0.50–1.10)
Glucose, Bld: 103 mg/dL — ABNORMAL HIGH (ref 70–99)
Potassium: 3.1 mEq/L — ABNORMAL LOW (ref 3.5–5.3)
Sodium: 144 mEq/L (ref 135–145)
Total Bilirubin: 0.7 mg/dL (ref 0.2–1.2)
Total Protein: 6.6 g/dL (ref 6.0–8.3)

## 2015-01-18 LAB — CBC
HCT: 38.7 % (ref 36.0–46.0)
Hemoglobin: 13.5 g/dL (ref 12.0–15.0)
MCH: 27.6 pg (ref 26.0–34.0)
MCHC: 34.9 g/dL (ref 30.0–36.0)
MCV: 79.1 fL (ref 78.0–100.0)
MPV: 9.3 fL (ref 8.6–12.4)
Platelets: 400 10*3/uL (ref 150–400)
RBC: 4.89 MIL/uL (ref 3.87–5.11)
RDW: 12.8 % (ref 11.5–15.5)
WBC: 10.8 10*3/uL — ABNORMAL HIGH (ref 4.0–10.5)

## 2015-02-22 ENCOUNTER — Other Ambulatory Visit: Payer: Self-pay | Admitting: Internal Medicine

## 2015-07-03 ENCOUNTER — Other Ambulatory Visit: Payer: Self-pay | Admitting: Internal Medicine

## 2015-07-03 DIAGNOSIS — Z0000 Encounter for general adult medical examination without abnormal findings: Secondary | ICD-10-CM

## 2015-07-15 ENCOUNTER — Other Ambulatory Visit (INDEPENDENT_AMBULATORY_CARE_PROVIDER_SITE_OTHER): Payer: 59

## 2015-07-15 ENCOUNTER — Encounter (INDEPENDENT_AMBULATORY_CARE_PROVIDER_SITE_OTHER): Payer: Self-pay

## 2015-07-15 DIAGNOSIS — Z0000 Encounter for general adult medical examination without abnormal findings: Secondary | ICD-10-CM | POA: Diagnosis not present

## 2015-07-15 LAB — COMPREHENSIVE METABOLIC PANEL
ALT: 19 U/L (ref 0–35)
AST: 15 U/L (ref 0–37)
Albumin: 3.7 g/dL (ref 3.5–5.2)
Alkaline Phosphatase: 99 U/L (ref 39–117)
BUN: 17 mg/dL (ref 6–23)
CO2: 32 mEq/L (ref 19–32)
Calcium: 9.4 mg/dL (ref 8.4–10.5)
Chloride: 100 mEq/L (ref 96–112)
Creatinine, Ser: 0.98 mg/dL (ref 0.40–1.20)
GFR: 80.57 mL/min (ref >60.00)
Glucose, Bld: 141 mg/dL — ABNORMAL HIGH (ref 70–99)
Potassium: 3 mEq/L — ABNORMAL LOW (ref 3.5–5.1)
Sodium: 140 mEq/L (ref 135–145)
Total Bilirubin: 0.5 mg/dL (ref 0.2–1.2)
Total Protein: 7.2 g/dL (ref 6.0–8.3)

## 2015-07-15 LAB — CBC
HCT: 43.1 % (ref 36.0–46.0)
Hemoglobin: 14.4 g/dL (ref 12.0–15.0)
MCHC: 33.3 g/dL (ref 30.0–36.0)
MCV: 81.7 fl (ref 78.0–100.0)
Platelets: 436 10*3/uL — ABNORMAL HIGH (ref 150.0–400.0)
RBC: 5.28 Mil/uL — ABNORMAL HIGH (ref 3.87–5.11)
RDW: 12.8 % (ref 11.5–15.5)
WBC: 10.4 10*3/uL (ref 4.0–10.5)

## 2015-07-15 LAB — LIPID PANEL
Cholesterol: 187 mg/dL (ref 0–200)
HDL: 53.2 mg/dL (ref >39.00)
LDL Cholesterol: 120 mg/dL — ABNORMAL HIGH (ref 0–99)
NonHDL: 134.09
Total CHOL/HDL Ratio: 4
Triglycerides: 69 mg/dL (ref 0.0–149.0)
VLDL: 13.8 mg/dL (ref 0.0–40.0)

## 2015-07-15 LAB — HEMOGLOBIN A1C: Hgb A1c MFr Bld: 6.4 % (ref 4.6–6.5)

## 2015-07-16 LAB — HIV ANTIBODY (ROUTINE TESTING W REFLEX): HIV 1&2 Ab, 4th Generation: NONREACTIVE

## 2015-07-22 ENCOUNTER — Encounter: Payer: 59 | Admitting: Internal Medicine

## 2015-08-21 ENCOUNTER — Encounter: Payer: 59 | Admitting: Internal Medicine

## 2016-01-07 ENCOUNTER — Other Ambulatory Visit: Payer: Self-pay | Admitting: Internal Medicine

## 2016-04-05 ENCOUNTER — Other Ambulatory Visit: Payer: Self-pay | Admitting: Internal Medicine

## 2016-05-15 ENCOUNTER — Other Ambulatory Visit: Payer: Self-pay | Admitting: Internal Medicine

## 2016-08-06 ENCOUNTER — Inpatient Hospital Stay
Admission: EM | Admit: 2016-08-06 | Discharge: 2016-08-07 | DRG: 195 | Disposition: A | Discharge status: Home / Self Care | Payer: 59 | Attending: Internal Medicine | Admitting: Internal Medicine

## 2016-08-06 ENCOUNTER — Encounter: Payer: Self-pay | Admitting: Emergency Medicine

## 2016-08-06 ENCOUNTER — Emergency Department: Payer: 59

## 2016-08-06 DIAGNOSIS — J101 Influenza due to other identified influenza virus with other respiratory manifestations: Principal | ICD-10-CM | POA: Diagnosis present

## 2016-08-06 DIAGNOSIS — I10 Essential (primary) hypertension: Secondary | ICD-10-CM | POA: Diagnosis present

## 2016-08-06 DIAGNOSIS — Z809 Family history of malignant neoplasm, unspecified: Secondary | ICD-10-CM | POA: Diagnosis not present

## 2016-08-06 DIAGNOSIS — Z9889 Other specified postprocedural states: Secondary | ICD-10-CM

## 2016-08-06 DIAGNOSIS — J9601 Acute respiratory failure with hypoxia: Secondary | ICD-10-CM

## 2016-08-06 DIAGNOSIS — R0789 Other chest pain: Secondary | ICD-10-CM | POA: Diagnosis not present

## 2016-08-06 DIAGNOSIS — Z8249 Family history of ischemic heart disease and other diseases of the circulatory system: Secondary | ICD-10-CM

## 2016-08-06 DIAGNOSIS — G4730 Sleep apnea, unspecified: Secondary | ICD-10-CM | POA: Diagnosis present

## 2016-08-06 DIAGNOSIS — Z7951 Long term (current) use of inhaled steroids: Secondary | ICD-10-CM | POA: Diagnosis not present

## 2016-08-06 DIAGNOSIS — Z91040 Latex allergy status: Secondary | ICD-10-CM | POA: Diagnosis not present

## 2016-08-06 DIAGNOSIS — R0902 Hypoxemia: Secondary | ICD-10-CM

## 2016-08-06 DIAGNOSIS — Z79899 Other long term (current) drug therapy: Secondary | ICD-10-CM | POA: Diagnosis not present

## 2016-08-06 DIAGNOSIS — Z888 Allergy status to other drugs, medicaments and biological substances status: Secondary | ICD-10-CM | POA: Diagnosis not present

## 2016-08-06 DIAGNOSIS — E876 Hypokalemia: Secondary | ICD-10-CM

## 2016-08-06 DIAGNOSIS — J111 Influenza due to unidentified influenza virus with other respiratory manifestations: Secondary | ICD-10-CM | POA: Diagnosis present

## 2016-08-06 DIAGNOSIS — E860 Dehydration: Secondary | ICD-10-CM | POA: Diagnosis present

## 2016-08-06 LAB — BASIC METABOLIC PANEL
Anion gap: 7 (ref 5–15)
Anion gap: 9 (ref 5–15)
BUN: 10 mg/dL (ref 6–20)
BUN: 8 mg/dL (ref 6–20)
CO2: 26 mmol/L (ref 22–32)
CO2: 25 mmol/L (ref 22–32)
Calcium: 8.7 mg/dL — ABNORMAL LOW (ref 8.9–10.3)
Calcium: 8.4 mg/dL — ABNORMAL LOW (ref 8.9–10.3)
Chloride: 100 mmol/L — ABNORMAL LOW (ref 101–111)
Chloride: 100 mmol/L — ABNORMAL LOW (ref 101–111)
Creatinine, Ser: 0.91 mg/dL (ref 0.44–1.00)
Creatinine, Ser: 0.84 mg/dL (ref 0.44–1.00)
GFR calc Af Amer: >60 mL/min (ref >60)
GFR calc Af Amer: >60 mL/min (ref >60)
GFR calc non Af Amer: >60 mL/min (ref >60)
GFR calc non Af Amer: >60 mL/min (ref >60)
Glucose, Bld: 179 mg/dL — ABNORMAL HIGH (ref 65–99)
Glucose, Bld: 156 mg/dL — ABNORMAL HIGH (ref 65–99)
Potassium: 2.7 mmol/L — CL (ref 3.5–5.1)
Potassium: 4 mmol/L (ref 3.5–5.1)
Sodium: 133 mmol/L — ABNORMAL LOW (ref 135–145)
Sodium: 134 mmol/L — ABNORMAL LOW (ref 135–145)

## 2016-08-06 LAB — CBC
HCT: 39.6 % (ref 35.0–47.0)
HCT: 40.7 % (ref 35.0–47.0)
Hemoglobin: 13.7 g/dL (ref 12.0–16.0)
Hemoglobin: 13.8 g/dL (ref 12.0–16.0)
MCH: 27.5 pg (ref 26.0–34.0)
MCH: 27.8 pg (ref 26.0–34.0)
MCHC: 33.9 g/dL (ref 32.0–36.0)
MCHC: 34.7 g/dL (ref 32.0–36.0)
MCV: 80.3 fL (ref 80.0–100.0)
MCV: 81.1 fL (ref 80.0–100.0)
Platelets: 306 10*3/uL (ref 150–440)
Platelets: 313 10*3/uL (ref 150–440)
RBC: 4.93 MIL/uL (ref 3.80–5.20)
RBC: 5.02 MIL/uL (ref 3.80–5.20)
RDW: 13 % (ref 11.5–14.5)
RDW: 13.1 % (ref 11.5–14.5)
WBC: 8.3 10*3/uL (ref 3.6–11.0)
WBC: 8.3 10*3/uL (ref 3.6–11.0)

## 2016-08-06 LAB — INFLUENZA PANEL BY PCR (TYPE A & B)
Influenza A By PCR: POSITIVE — AB
Influenza B By PCR: NEGATIVE

## 2016-08-06 LAB — URINALYSIS, ROUTINE W REFLEX MICROSCOPIC
Bacteria, UA: NONE SEEN
Bilirubin Urine: NEGATIVE
Glucose, UA: NEGATIVE mg/dL
Ketones, ur: NEGATIVE mg/dL
Leukocytes, UA: NEGATIVE
Nitrite: NEGATIVE
Protein, ur: NEGATIVE mg/dL
Specific Gravity, Urine: 1.01 (ref 1.005–1.030)
pH: 5 (ref 5.0–8.0)

## 2016-08-06 LAB — MAGNESIUM
Magnesium: 1.5 mg/dL — ABNORMAL LOW (ref 1.7–2.4)
Magnesium: 2.2 mg/dL (ref 1.7–2.4)
Magnesium: 1.8 mg/dL (ref 1.7–2.4)

## 2016-08-06 LAB — TROPONIN I: Troponin I: <0.03 ng/mL (ref <0.03)

## 2016-08-06 LAB — POTASSIUM: Potassium: 3.3 mmol/L — ABNORMAL LOW (ref 3.5–5.1)

## 2016-08-06 MED ORDER — METOPROLOL TARTRATE 25 MG PO TABS
25.0000 mg | ORAL_TABLET | Freq: Once | ORAL | Status: AC
  Administered 2016-08-06: 25 mg via ORAL
  Filled 2016-08-06: qty 1

## 2016-08-06 MED ORDER — OSELTAMIVIR PHOSPHATE 75 MG PO CAPS
75.0000 mg | ORAL_CAPSULE | Freq: Once | ORAL | Status: AC
  Administered 2016-08-06: 75 mg via ORAL
  Filled 2016-08-06: qty 1

## 2016-08-06 MED ORDER — SODIUM CHLORIDE 0.9 % IV SOLN
INTRAVENOUS | Status: DC
  Administered 2016-08-06 – 2016-08-07 (×2): via INTRAVENOUS

## 2016-08-06 MED ORDER — SERTRALINE HCL 50 MG PO TABS
50.0000 mg | ORAL_TABLET | Freq: Every day | ORAL | Status: DC
  Administered 2016-08-06 – 2016-08-07 (×2): 50 mg via ORAL
  Filled 2016-08-06 (×2): qty 1

## 2016-08-06 MED ORDER — FLUTICASONE PROPIONATE 50 MCG/ACT NA SUSP
1.0000 | Freq: Every day | NASAL | Status: DC | PRN
  Filled 2016-08-06: qty 16

## 2016-08-06 MED ORDER — OXYMETAZOLINE HCL 0.05 % NA SOLN: 2.0000 | Freq: Two times a day (BID) | NASAL | Status: DC | PRN

## 2016-08-06 MED ORDER — ACETAMINOPHEN 650 MG RE SUPP: 650.0000 mg | Freq: Four times a day (QID) | RECTAL | Status: DC | PRN

## 2016-08-06 MED ORDER — METOPROLOL TARTRATE 25 MG PO TABS
12.5000 mg | ORAL_TABLET | Freq: Two times a day (BID) | ORAL | Status: DC
  Administered 2016-08-06: 12.5 mg via ORAL
  Administered 2016-08-07: 25 mg via ORAL
  Filled 2016-08-06 (×2): qty 1

## 2016-08-06 MED ORDER — SENNOSIDES-DOCUSATE SODIUM 8.6-50 MG PO TABS: 1.0000 | ORAL_TABLET | Freq: Every evening | ORAL | Status: DC | PRN

## 2016-08-06 MED ORDER — HYDROCODONE-ACETAMINOPHEN 5-325 MG PO TABS
1.0000 | ORAL_TABLET | ORAL | Status: DC | PRN
  Administered 2016-08-06: 2 via ORAL
  Administered 2016-08-06: 1 via ORAL
  Administered 2016-08-06: 2 via ORAL
  Filled 2016-08-06 (×2): qty 2
  Filled 2016-08-06: qty 1

## 2016-08-06 MED ORDER — SODIUM CHLORIDE 0.9 % IV BOLUS (SEPSIS)
1000.0000 mL | INTRAVENOUS | Status: AC
  Administered 2016-08-06: 1000 mL via INTRAVENOUS

## 2016-08-06 MED ORDER — LABETALOL HCL 5 MG/ML IV SOLN
10.0000 mg | Freq: Once | INTRAVENOUS | Status: AC
  Administered 2016-08-06: 10 mg via INTRAVENOUS
  Filled 2016-08-06: qty 4

## 2016-08-06 MED ORDER — HYDRALAZINE HCL 20 MG/ML IJ SOLN
10.0000 mg | INTRAMUSCULAR | Status: DC | PRN
  Administered 2016-08-06: 10 mg via INTRAVENOUS
  Filled 2016-08-06: qty 1

## 2016-08-06 MED ORDER — ONDANSETRON HCL 4 MG PO TABS: 4.0000 mg | ORAL_TABLET | Freq: Four times a day (QID) | ORAL | Status: DC | PRN

## 2016-08-06 MED ORDER — ACETAMINOPHEN 325 MG PO TABS
650.0000 mg | ORAL_TABLET | Freq: Four times a day (QID) | ORAL | Status: DC | PRN
  Administered 2016-08-07: 650 mg via ORAL
  Filled 2016-08-06: qty 2

## 2016-08-06 MED ORDER — ENOXAPARIN SODIUM 40 MG/0.4ML ~~LOC~~ SOLN
40.0000 mg | SUBCUTANEOUS | Status: DC
  Administered 2016-08-06 – 2016-08-07 (×2): 40 mg via SUBCUTANEOUS
  Filled 2016-08-06 (×2): qty 0.4

## 2016-08-06 MED ORDER — ONDANSETRON HCL 4 MG/2ML IJ SOLN
4.0000 mg | Freq: Four times a day (QID) | INTRAMUSCULAR | Status: DC | PRN
  Administered 2016-08-06: 4 mg via INTRAVENOUS
  Filled 2016-08-06: qty 2

## 2016-08-06 MED ORDER — MORPHINE SULFATE (PF) 4 MG/ML IV SOLN
4.0000 mg | Freq: Once | INTRAVENOUS | Status: AC
Start: 2016-08-06 — End: 2016-08-06
  Administered 2016-08-06: 4 mg via INTRAVENOUS
  Filled 2016-08-06: qty 1

## 2016-08-06 MED ORDER — ONDANSETRON HCL 4 MG/2ML IJ SOLN
4.0000 mg | INTRAMUSCULAR | Status: AC
  Administered 2016-08-06: 4 mg via INTRAVENOUS
  Filled 2016-08-06: qty 2

## 2016-08-06 MED ORDER — POTASSIUM CHLORIDE CRYS ER 20 MEQ PO TBCR
40.0000 meq | EXTENDED_RELEASE_TABLET | Freq: Once | ORAL | Status: AC
  Administered 2016-08-06: 40 meq via ORAL
  Filled 2016-08-06: qty 2

## 2016-08-06 MED ORDER — OSELTAMIVIR PHOSPHATE 75 MG PO CAPS
75.0000 mg | ORAL_CAPSULE | Freq: Two times a day (BID) | ORAL | Status: DC
  Administered 2016-08-06 – 2016-08-07 (×3): 75 mg via ORAL
  Filled 2016-08-06 (×3): qty 1

## 2016-08-06 MED ORDER — SODIUM CHLORIDE 0.9 % IV SOLN
30.0000 meq | Freq: Once | INTRAVENOUS | Status: AC
  Administered 2016-08-06: 30 meq via INTRAVENOUS
  Filled 2016-08-06: qty 15

## 2016-08-06 MED ORDER — MAGNESIUM SULFATE 2 GM/50ML IV SOLN
2.0000 g | Freq: Once | INTRAVENOUS | Status: AC
  Administered 2016-08-06: 2 g via INTRAVENOUS
  Filled 2016-08-06: qty 50

## 2016-08-06 NOTE — ED Notes (Signed)
Patient asked for warm blanket

## 2016-08-06 NOTE — H&P (Signed)
[place] [place] NAME: Martha Randall    MR#:  [id number]  DATE OF BIRTH:  1975/11/18  DATE OF ADMISSION:  08/06/2016  PRIMARY CARE PHYSICIAN: Webb Silversmith, NP   REQUESTING/REFERRING PHYSICIAN:   CHIEF COMPLAINT:   Chief Complaint  Patient presents with  . Hypertension  . Fever    HISTORY OF PRESENT ILLNESS: Martha Randall  is a 41 y.o. female with a known history of Hypertension, sleep apnea presented to the emergency room with fever, body aches for the last 2 days. Patient has some cough which is dry in nature. She presented to the emergency room her oxygen saturation was also low. Patient uses CPAP at bedtime for sleep apnea. She has low-grade fever, chills and body pains. Patient was evaluated in the emergency room she was tested positive for influenza. She was given oral Tamiflu. Her potassium and magnesium were low and she appeared dry and dehydrated. No complaints of any chest pain, orthopnea. Has some headache which is on and off. No history of any dizziness, blurry vision. Has generalized weakness and fatigue. Patient's husband was also sick at home with a respiratory infection but he tested negative for flu. Hospitalist service was consulted for further care of the patient.  PAST MEDICAL HISTORY:   Past Medical History:  Diagnosis Date  . Allergy   . Chicken pox   . Depression   . Frequent headaches    otc med prn  . History of blood transfusion 07/2009   [place] - 2 units transfused  . Hypertension   . Sleep apnea    uses cpap nightly    PAST SURGICAL HISTORY: Past Surgical History:  Procedure Laterality Date  . BREAST BIOPSY Right 2004   lump is tagged  . CESAREAN SECTION     x 1  . HYSTEROSCOPY N/A 10/08/2014   Procedure: HYSTEROSCOPIC IUD Removal/Nexplanon Insertion;  Surgeon: Servando Salina, MD;  Location: [facility];  Service: Gynecology;  Laterality: N/A;  45 min.  [facility] HYSTEROSCOPY W/D&C N/A  09/26/2014   Procedure: DILATATION AND CURETTAGE /HYSTEROSCOPY/Hysteroscopic IUD Removal, NEXPLANON INSERTION ;  Surgeon: Servando Salina, MD;  Location: [facility];  Service: Gynecology;  Laterality: N/A;  . SEPTOPLASTY    . WISDOM TOOTH EXTRACTION      SOCIAL HISTORY:  Social History  Substance Use Topics  . Smoking status: Never Smoker  . Smokeless tobacco: Never Used  . Alcohol use 0.6 oz/week    1 Glasses of wine per week     Comment: occasional wine    FAMILY HISTORY:  Family History  Problem Relation Age of Onset  . Hypertension Mother   . Arthritis Maternal Aunt   . Arthritis Maternal Grandmother   . Cancer Maternal Grandmother     Breast  . Hypertension Maternal Grandmother     DRUG ALLERGIES:  Allergies  Allergen Reactions  . Latex Hives  . Prednisone Swelling, Rash and Other (See Comments)    Elevated blood pressure    REVIEW OF SYSTEMS:   CONSTITUTIONAL: Has fever,  weakness.  EYES: No blurred or double vision.  EARS, NOSE, AND THROAT: No tinnitus or ear pain.  RESPIRATORY: Has cough, shortness of breath  No wheezing or hemoptysis.  CARDIOVASCULAR: No chest pain, orthopnea, edema.  GASTROINTESTINAL: No nausea, vomiting, diarrhea or abdominal pain.  GENITOURINARY: No dysuria, hematuria.  ENDOCRINE: No polyuria, nocturia,  HEMATOLOGY: No anemia, easy bruising or bleeding SKIN: No rash or lesion. MUSCULOSKELETAL: No joint pain  or arthritis.   NEUROLOGIC: No tingling, numbness, weakness.  PSYCHIATRY: No anxiety or depression.   MEDICATIONS AT HOME:  Prior to Admission medications   Medication Sig Start Date End Date Taking? Authorizing Provider  Butalbital-APAP-Caffeine 50-300-40 MG CAPS Take 1 tablet by mouth daily as needed. 01/17/15  Yes Jearld Fenton, NP  etonogestrel (NEXPLANON) 68 MG IMPL implant 1 each by Subdermal route once. Inserted 10/08/2014   Yes Historical Provider, MD  fluticasone (FLONASE) 50 MCG/ACT nasal spray Place 1 spray into both  nostrils daily as needed for allergies.  07/08/14  Yes Historical Provider, MD  hydrochlorothiazide (HYDRODIURIL) 25 MG tablet Take 1 tablet (25 mg total) by mouth daily. MUST SCHEDULE ANNUAL PHYSICAL FOR MORE REFILLS 01/07/16  Yes Jearld Fenton, NP  ibuprofen (ADVIL,MOTRIN) 200 MG tablet Take 2 tablets (400 mg total) by mouth every 6 (six) hours as needed for moderate pain. 10/08/14  Yes Servando Salina, MD  oxymetazoline (AFRIN) 0.05 % nasal spray Place 2 sprays into both nostrils 2 (two) times daily as needed for congestion.   Yes Historical Provider, MD  sertraline (ZOLOFT) 50 MG tablet TAKE 1 TABLET (50 MG TOTAL) BY MOUTH DAILY. NO MORE REFILLS WITHOUT ANNUAL EXAM VISIT 05/17/16  Yes Jearld Fenton, NP  potassium chloride SA (K-DUR,KLOR-CON) 20 MEQ tablet Take 1 tablet (20 mEq total) by mouth 2 (two) times daily. Patient not taking: Reported on 08/06/2016 09/30/14   Jearld Fenton, NP      PHYSICAL EXAMINATION:   VITAL SIGNS: Blood pressure (!) 176/132, pulse (!) 106, temperature 99.7 F (37.6 C), temperature source Oral, resp. rate (!) 22, height 5\' 2"  (1.575 m), weight 92.1 kg (203 lb), SpO2 96 %.  GENERAL:  41 y.o.-year-old patient lying in the bed with no acute distress.  EYES: Pupils equal, round, reactive to light and accommodation. No scleral icterus. Extraocular muscles intact.  HEENT: Head atraumatic, normocephalic. Oropharynx dry and nasopharynx clear.  NECK:  Supple, no jugular venous distention. No thyroid enlargement, no tenderness.  LUNGS: Normal breath sounds bilaterally, no wheezing,scattered rhonchi heard. No use of accessory muscles of respiration.  CARDIOVASCULAR: S1, S2 tachycardia noted. No murmurs, rubs, or gallops.  ABDOMEN: Soft, nontender, nondistended. Bowel sounds present. No organomegaly or mass.  EXTREMITIES: No pedal edema, cyanosis, or clubbing.  NEUROLOGIC: Cranial nerves II through XII are intact. Muscle strength 5/5 in all extremities. Sensation intact.  Gait not checked.  PSYCHIATRIC: The patient is alert and oriented x 3.  SKIN: No obvious rash, lesion, or ulcer.   LABORATORY PANEL:   CBC  Recent Labs Lab 08/06/16 0024  WBC 8.3  HGB 13.7  HCT 39.6  PLT 306  MCV 80.3  MCH 27.8  MCHC 34.7  RDW 13.0   ------------------------------------------------------------------------------------------------------------------  Chemistries   Recent Labs Lab 08/06/16 0024  NA 133*  K 2.7*  CL 100*  CO2 26  GLUCOSE 179*  BUN 10  CREATININE 0.91  CALCIUM 8.7*  MG 1.5*   ------------------------------------------------------------------------------------------------------------------ estimated creatinine clearance is 85.9 mL/min (by C-G formula based on SCr of 0.91 mg/dL). ------------------------------------------------------------------------------------------------------------------ No results for input(s): TSH, T4TOTAL, T3FREE, THYROIDAB in the last 72 hours.  Invalid input(s): FREET3   Coagulation profile No results for input(s): INR, PROTIME in the last 168 hours. ------------------------------------------------------------------------------------------------------------------- No results for input(s): DDIMER in the last 72 hours. -------------------------------------------------------------------------------------------------------------------  Cardiac Enzymes  Recent Labs Lab 08/06/16 0024  TROPONINI <0.03   ------------------------------------------------------------------------------------------------------------------ Invalid input(s): POCBNP  ---------------------------------------------------------------------------------------------------------------  Urinalysis    Component Value Date/Time  COLORURINE STRAW (A) 08/06/2016 0218   APPEARANCEUR CLEAR (A) 08/06/2016 0218   LABSPEC 1.010 08/06/2016 0218   PHURINE 5.0 08/06/2016 0218   GLUCOSEU NEGATIVE 08/06/2016 0218   HGBUR SMALL (A) 08/06/2016 0218    HGBUR negative 07/22/2010 0957   BILIRUBINUR NEGATIVE 08/06/2016 [place] 08/06/2016 0218   PROTEINUR NEGATIVE 08/06/2016 0218   UROBILINOGEN 0.2 07/22/2010 0957   NITRITE NEGATIVE 08/06/2016 0218   LEUKOCYTESUR NEGATIVE 08/06/2016 0218     RADIOLOGY: Dg Chest 2 View  Result Date: 08/06/2016 CLINICAL DATA:  Hypertension, chest discomfort EXAM: CHEST  2 VIEW COMPARISON:  None. FINDINGS: The heart size and mediastinal contours are within normal limits. No pulmonary consolidations. Mild bronchitic change may explain the slight increase in interstitial prominence noted bilaterally. No effusion or pneumothorax. The visualized skeletal structures are unremarkable. IMPRESSION: Mild increase in interstitial prominence may reflect bronchitic change. Otherwise negative exam. Electronically Signed   By: Ashley Royalty M.D.   On: 08/06/2016 00:44    EKG: Orders placed or performed during the hospital encounter of 08/06/16  . ED EKG within 10 minutes  . ED EKG within 10 minutes    IMPRESSION AND PLAN: 41 year old female patient with history of sleep apnea, hypertension presented to the emergency room with fever, body aches, cough. Admitting diagnosis 1. Influenza infection 2. Systemic inflammatory response syndrome secondary to influenza infection 3. Severe Hypokalemia 4. Hypomagnesemia 5. Dehydration 6. Uncontrolled hypertension Treatment plan Admit patient to medical floor Aggressive IV fluid hydration Start patient on oral Tamiflu Replace potassium intravenously and orally Replace magnesium intravenously Monitor electrolytes  Hold HCTZ diuretic Control blood pressure with when necessary hydralazine as needed Supportive care.   All the records are reviewed and case discussed with ED provider. Management plans discussed with the patient, family and they are in agreement.  CODE STATUS:FULL CODE    Code Status Orders        Start     Ordered   08/06/16 0436   Full code  Continuous     08/06/16 0435    Code Status History    Date Active Date Inactive Code Status Order ID Comments User Context   This patient has a current code status but no historical code status.       TOTAL TIME TAKING CARE OF THIS PATIENT: 52 minutes.    Saundra Shelling M.D on 08/06/2016 at 4:55 AM  Between 7am to 6pm - Pager - [phone]  After 6pm go to [email or web address] - password [email or web address] [facility]  [facility]  Office  [phone]  CC: Primary care physician; Webb Silversmith, NP

## 2016-08-06 NOTE — Progress Notes (Signed)
Pt arrived via stretcher from Er at 1000, pt walked to the bed. Pt is alert and oriented, on room air, lungs are clear. HR is regular, bp is 158/81, pt is c/o headache unrelieved, stated she had a migraine yesterday. Abdomen is soft, bs heard. PIV #20 intact to L arm with iv ns hung at 12mls/hr, site is free of redness and swelling.ppp, no edema noted. Since arrival, pt oriented to room and call bell, pt has received meds due and vicodin x2 for unrelieved headache. Pt is taking po. Call bell in reach.

## 2016-08-06 NOTE — ED Notes (Signed)
Pt states HA at forehead since yest, fever, and HIGH BP. Pt took BP meds today. At home she checked it and it was high so she tried to go to a walkin clinic but was closed. Tmax 101 at home, states alternating tylenol and ibuprofen. Pt denies blurred vision. Pt asked for lights to be dimmed. Pt alert and oriented, wheeled to room, talking in complete sentences.

## 2016-08-06 NOTE — ED Notes (Signed)
Called pharmacy to send up Apresoline since patient's BP meets criteria for administration.

## 2016-08-06 NOTE — ED Notes (Signed)
Almyra Free, RN to page Dr. Margaretmary Eddy to see if she can get order for PO blood pressure medication before patient is taken to the floor.

## 2016-08-06 NOTE — ED Provider Notes (Signed)
[facility] Emergency Department Provider Note  ____________________________________________   First MD Initiated Contact with Patient 08/06/16 0141     (approximate)  I have reviewed the triage vital signs and the nursing notes.   HISTORY  Chief Complaint Hypertension and Fever    HPI Martha Randall is a 41 y.o. female with a past medical history as listed below who presents for evaluation of about 2 days of a constellation of symptoms including headache, chest discomfort, cough, generalized body aches, hypertension, general malaise and fatigue.  She reports that she has been taking her blood pressure medicine (HCTZ) as recommended as well as her usual potassium supplements.  She has had decreased oral intake over the last 2 days due to not feeling well.  The symptoms as being gradual in onset and gradually getting worse and are now severe.  She noticed at home that her blood pressure was significantly elevated as she thought she should come to the emergency department.  She has had nausea but no vomiting.  She has had several loose stools but not what she would consider diarrhea.  She did not mention any of the bilateral arm weakness to me that she mention in triage and he denies any numbness or tingling or specific focal weakness in any of her extremities, just generalized weakness.  She also reports a fever at home measured up to 101.  She has not had any neck stiffness.   Past Medical History:  Diagnosis Date  . Allergy   . Chicken pox   . Depression   . Frequent headaches    otc med prn  . History of blood transfusion 07/2009   [place] - 2 units transfused  . Hypertension   . Sleep apnea    uses cpap nightly    Patient Active Problem List   Diagnosis Date Noted  . Flu 08/06/2016  . OSA (obstructive sleep apnea) 01/17/2015  . HTN (hypertension) 01/28/2014  . Obesity (BMI 30-39.9) 12/17/2013  . Depression 12/17/2013  . Migraine 07/22/2010    . Allergic rhinitis 07/22/2010  . Asthma 07/22/2010  . URINARY INCONTINENCE, URGE 07/22/2010    Past Surgical History:  Procedure Laterality Date  . BREAST BIOPSY Right 2004   lump is tagged  . CESAREAN SECTION     x 1  . HYSTEROSCOPY N/A 10/08/2014   Procedure: HYSTEROSCOPIC IUD Removal/Nexplanon Insertion;  Surgeon: Servando Salina, MD;  Location: [facility];  Service: Gynecology;  Laterality: N/A;  45 min.  Marland Kitchen HYSTEROSCOPY W/D&C N/A 09/26/2014   Procedure: DILATATION AND CURETTAGE /HYSTEROSCOPY/Hysteroscopic IUD Removal, NEXPLANON INSERTION ;  Surgeon: Servando Salina, MD;  Location: [facility];  Service: Gynecology;  Laterality: N/A;  . SEPTOPLASTY    . WISDOM TOOTH EXTRACTION      Prior to Admission medications   Medication Sig Start Date End Date Taking? Authorizing Provider  Butalbital-APAP-Caffeine 50-300-40 MG CAPS Take 1 tablet by mouth daily as needed. 01/17/15  Yes Jearld Fenton, NP  etonogestrel (NEXPLANON) 68 MG IMPL implant 1 each by Subdermal route once. Inserted 10/08/2014   Yes Historical Provider, MD  fluticasone (FLONASE) 50 MCG/ACT nasal spray Place 1 spray into both nostrils daily as needed for allergies.  07/08/14  Yes Historical Provider, MD  hydrochlorothiazide (HYDRODIURIL) 25 MG tablet Take 1 tablet (25 mg total) by mouth daily. MUST SCHEDULE ANNUAL PHYSICAL FOR MORE REFILLS 01/07/16  Yes Jearld Fenton, NP  ibuprofen (ADVIL,MOTRIN) 200 MG tablet Take 2 tablets (400 mg total) by mouth  every 6 (six) hours as needed for moderate pain. 10/08/14  Yes Servando Salina, MD  oxymetazoline (AFRIN) 0.05 % nasal spray Place 2 sprays into both nostrils 2 (two) times daily as needed for congestion.   Yes Historical Provider, MD  sertraline (ZOLOFT) 50 MG tablet TAKE 1 TABLET (50 MG TOTAL) BY MOUTH DAILY. NO MORE REFILLS WITHOUT ANNUAL EXAM VISIT 05/17/16  Yes Jearld Fenton, NP  potassium chloride SA (K-DUR,KLOR-CON) 20 MEQ tablet Take 1 tablet (20 mEq total) by mouth 2 (two)  times daily. Patient not taking: Reported on 08/06/2016 09/30/14   Jearld Fenton, NP    Allergies Latex and Prednisone  Family History  Problem Relation Age of Onset  . Hypertension Mother   . Arthritis Maternal Aunt   . Arthritis Maternal Grandmother   . Cancer Maternal Grandmother     Breast  . Hypertension Maternal Grandmother     Social History Social History  Substance Use Topics  . Smoking status: Never Smoker  . Smokeless tobacco: Never Used  . Alcohol use 0.6 oz/week    1 Glasses of wine per week     Comment: occasional wine    Review of Systems Constitutional: +fever/chills with myalgias and generalized body aches and malaise Eyes: No visual changes. ENT: No sore throat. Cardiovascular: +chest comfort Respiratory: Denies shortness of breath.  Frequent cough Gastrointestinal: No abdominal pain.  nausea, no vomiting.  Several loose stools.  No constipation. Genitourinary: Negative for dysuria. Musculoskeletal: Negative for back pain. Skin: Negative for rash. Neurological: +headache, no focal weakness or numbness.  10-point ROS otherwise negative.  ____________________________________________   PHYSICAL EXAM:  VITAL SIGNS: ED Triage Vitals  Enc Vitals Group     BP 08/06/16 0016 (!) 188/120     Pulse Rate 08/06/16 0016 (!) 118     Resp 08/06/16 0016 20     Temp 08/06/16 0016 99.7 F (37.6 C)     Temp Source 08/06/16 0016 Oral     SpO2 08/06/16 0016 98 %     Weight 08/06/16 0017 203 lb (92.1 kg)     Height 08/06/16 0017 5\' 2"  (1.575 m)     Head Circumference --      Peak Flow --      Pain Score 08/06/16 0017 7     Pain Loc --      Pain Edu? --      Excl. in [place]? --     Constitutional: Alert and Oriented.  Appears uncomfortable but nontoxic Eyes: Conjunctivae are normal. PERRL. EOMI. Head: Atraumatic. Nose: Mild congestion/rhinnorhea. Mouth/Throat: Mucous membranes are moist.   Neck: No stridor.  No meningeal signs.   Cardiovascular:  Tachycardia, regular rhythm. Good peripheral circulation. Grossly normal heart sounds. Respiratory: Normal respiratory effort.  No retractions. Lungs CTAB. Gastrointestinal: Soft and nontender. No distention.  Musculoskeletal: No lower extremity tenderness nor edema. No gross deformities of extremities. Neurologic:  Normal speech and language. No gross focal neurologic deficits are appreciated.  Skin:  Skin is warm, dry and intact. No rash noted. Psychiatric: Mood and affect are normal. Speech and behavior are normal.  ____________________________________________   LABS (all labs ordered are listed, but only abnormal results are displayed)  Labs Reviewed  BASIC METABOLIC PANEL - Abnormal; Notable for the following:       Result Value   Sodium 133 (*)    Potassium 2.7 (*)    Chloride 100 (*)    Glucose, Bld 179 (*)    Calcium 8.7 (*)  All other components within normal limits  MAGNESIUM - Abnormal; Notable for the following:    Magnesium 1.5 (*)    All other components within normal limits  INFLUENZA PANEL BY PCR (TYPE A & B) - Abnormal; Notable for the following:    Influenza A By PCR POSITIVE (*)    All other components within normal limits  URINALYSIS, ROUTINE W REFLEX MICROSCOPIC - Abnormal; Notable for the following:    Color, Urine STRAW (*)    APPearance CLEAR (*)    Hgb urine dipstick SMALL (*)    Squamous Epithelial / LPF 0-5 (*)    All other components within normal limits  CBC  TROPONIN I  CBC  CREATININE, SERUM  BASIC METABOLIC PANEL  CBC   ____________________________________________  EKG  ED ECG REPORT I, Rhiana Morash, the attending physician, personally viewed and interpreted this ECG.  Date: 08/06/2016 EKG Time: 00:21 Rate: 107 Rhythm: sinus tachycardia QRS Axis: normal Intervals: normal ST/T Wave abnormalities: normal Conduction Disturbances: none Narrative Interpretation:  unremarkable  ____________________________________________  RADIOLOGY   Dg Chest 2 View  Result Date: 08/06/2016 CLINICAL DATA:  Hypertension, chest discomfort EXAM: CHEST  2 VIEW COMPARISON:  None. FINDINGS: The heart size and mediastinal contours are within normal limits. No pulmonary consolidations. Mild bronchitic change may explain the slight increase in interstitial prominence noted bilaterally. No effusion or pneumothorax. The visualized skeletal structures are unremarkable. IMPRESSION: Mild increase in interstitial prominence may reflect bronchitic change. Otherwise negative exam. Electronically Signed   By: Ashley Royalty M.D.   On: 08/06/2016 00:44    ____________________________________________   PROCEDURES  Procedure(s) performed:   Procedures   Critical Care performed: No ____________________________________________   INITIAL IMPRESSION / ASSESSMENT AND PLAN / ED COURSE  Pertinent labs & imaging results that were available during my care of the patient were reviewed by me and considered in my medical decision making (see chart for details).  No evidence of acute focal neurological deficit.  Her symptoms sound most consistent with an influenza-like illness.  Her chest x-ray is unremarkable.  I will give her a liter of fluids and check influenza swab.  Her potassium is significantly decreased although it seems based on her old labs that her normal potassium is approximately 3.0 and today it is 2.7.  I will replete it as well as repleting her magnesium.  There is no evidence of acute CVA/TIA and she has a nontender abdomen.   Clinical Course as of Aug 06 514  Fri Aug 06, 2016  [id number] The patient has been hypoxemia and requiring 2L O2 [place].  +Influenza A.  Given constellation of symptoms including hypoxemia, will admit for further management.  Hypertension still uncontrolled, giving labetalol 10 mg IV.  [CF]    Clinical Course User Index [CF] Hinda Kehr, MD     ____________________________________________  FINAL CLINICAL IMPRESSION(S) / ED DIAGNOSES  Final diagnoses:  Influenza A  Hypoxemia  Uncontrolled hypertension  Hypokalemia  Hypomagnesemia  Acute respiratory failure with hypoxemia (HCC)     MEDICATIONS GIVEN DURING THIS VISIT:  Medications  oseltamivir (TAMIFLU) capsule 75 mg (not administered)  potassium chloride 30 mEq in sodium chloride 0.9 % 265 mL (KCL MULTIRUN) IVPB (not administered)  sertraline (ZOLOFT) tablet 50 mg (not administered)  fluticasone (FLONASE) 50 MCG/ACT nasal spray 1 spray (not administered)  oxymetazoline (AFRIN) 0.05 % nasal spray 2 spray (not administered)  enoxaparin (LOVENOX) injection 40 mg (not administered)  0.9 %  sodium chloride infusion (not administered)  acetaminophen (  TYLENOL) tablet 650 mg (not administered)    Or  acetaminophen (TYLENOL) suppository 650 mg (not administered)  HYDROcodone-acetaminophen (NORCO/VICODIN) 5-325 MG per tablet 1-2 tablet (not administered)  senna-docusate (Senokot-S) tablet 1 tablet (not administered)  ondansetron (ZOFRAN) tablet 4 mg (not administered)    Or  ondansetron (ZOFRAN) injection 4 mg (not administered)  oseltamivir (TAMIFLU) capsule 75 mg (not administered)  hydrALAZINE (APRESOLINE) injection 10 mg (not administered)  potassium chloride SA (K-DUR,KLOR-CON) CR tablet 40 mEq (40 mEq Oral Given 08/06/16 0207)  magnesium sulfate IVPB 2 g 50 mL (0 g Intravenous Stopped 08/06/16 0325)  sodium chloride 0.9 % bolus 1,000 mL (0 mLs Intravenous Stopped 08/06/16 0345)  morphine 4 MG/ML injection 4 mg (4 mg Intravenous Given 08/06/16 0208)  ondansetron (ZOFRAN) injection 4 mg (4 mg Intravenous Given 08/06/16 0208)  labetalol (NORMODYNE,TRANDATE) injection 10 mg (10 mg Intravenous Given 08/06/16 0347)     NEW OUTPATIENT MEDICATIONS STARTED DURING THIS VISIT:  New Prescriptions   No medications on file    Modified Medications   No medications on file     Discontinued Medications   No medications on file     Note:  This document was prepared using Dragon voice recognition software and may include unintentional dictation errors.    Hinda Kehr, MD 08/06/16 [id number]

## 2016-08-06 NOTE — ED Triage Notes (Signed)
Pt presents to ED with c/o hypertension starting today and fever and bilateral arm weakness since yesterday. Pt reports checked bp at home due to having chest discomfort and weakness earlier today, states bp at home was 170/120. Pt reports taking HCTZ at home this morning. Pt reports chest discomfort has subsided but bilateral arm weakness is still present. Bilateral equal hand strength. Pt alert and oriented x 4, respirations even and unlabored

## 2016-08-06 NOTE — ED Notes (Signed)
Patient assisted to restroom. Patient steady on feet, no difficulties ambulating to the restroom.

## 2016-08-06 NOTE — ED Notes (Signed)
Pt presents to ED 08 c/o headache that has lasted for the last 2 days; pt also c/o body aches; stomach ache; shortness of breath, some dizziness, and nausea; pt denies any vomiting or diarrhea; pt states feeling cold, but not running a fever; pt has a hx of HTN; pt states taking medication

## 2016-08-06 NOTE — ED Notes (Signed)
Pt ambulated to commode and back to bed; pt displays steady gait; no apparent distress; pt is able to speak in complete sentences.

## 2016-08-06 NOTE — ED Notes (Signed)
Pt's O2 sats noted in the 87-88% range; pt put on 2L O2 via Nasal Cannula per RN judgement; O2 sats increased to 97%.

## 2016-08-06 NOTE — Progress Notes (Signed)
Progress note  Patient was seen and evaluated in the emergency department. Reporting body aches and generalized weakness but shortness of breath is better  Plan of care  Influenza continue Tamiflu and hydration with IV fluids. Bronchodilators and inhalers  Hypokalemia replete potassium and check BMP and magnesium hold hydrochlorothiazide  Hypertension blood elevated blood pressure metoprolol is added to the regimen   Plan of care discussed with the patient. She is aware.   Time spent 30 minutes

## 2016-08-07 LAB — BASIC METABOLIC PANEL
Anion gap: 5 (ref 5–15)
BUN: 7 mg/dL (ref 6–20)
CO2: 25 mmol/L (ref 22–32)
Calcium: 7.9 mg/dL — ABNORMAL LOW (ref 8.9–10.3)
Chloride: 103 mmol/L (ref 101–111)
Creatinine, Ser: 0.72 mg/dL (ref 0.44–1.00)
GFR calc Af Amer: >60 mL/min (ref >60)
GFR calc non Af Amer: >60 mL/min (ref >60)
Glucose, Bld: 155 mg/dL — ABNORMAL HIGH (ref 65–99)
Potassium: 3.3 mmol/L — ABNORMAL LOW (ref 3.5–5.1)
Sodium: 133 mmol/L — ABNORMAL LOW (ref 135–145)

## 2016-08-07 LAB — MAGNESIUM: Magnesium: 1.9 mg/dL (ref 1.7–2.4)

## 2016-08-07 MED ORDER — OSELTAMIVIR PHOSPHATE 75 MG PO CAPS: 75.0000 mg | ORAL_CAPSULE | Freq: Two times a day (BID) | ORAL | 0 refills | Status: DC

## 2016-08-07 MED ORDER — POTASSIUM CHLORIDE CRYS ER 20 MEQ PO TBCR
40.0000 meq | EXTENDED_RELEASE_TABLET | Freq: Once | ORAL | Status: AC
  Administered 2016-08-07: 40 meq via ORAL
  Filled 2016-08-07: qty 2

## 2016-08-07 NOTE — Progress Notes (Signed)
Pt discharged home with spouse via w/c. I/s on use of masks,hand sanitizer and monitoring temps bid. Understanding voiced. Received dose of tamiflu this morning . Next dose due tonight. Pt verbalized understanding. Local pharmacy has med available upon discharge

## 2016-08-07 NOTE — Discharge Summary (Signed)
[place] at [place] NAME: Martha Randall    MR#:  [id number]  DATE OF BIRTH:  10-07-1975  DATE OF ADMISSION:  08/06/2016 ADMITTING PHYSICIAN: Saundra Shelling, MD  DATE OF DISCHARGE: 08/07/2016  PRIMARY CARE PHYSICIAN: Webb Silversmith, NP    ADMISSION DIAGNOSIS:  Hypoxemia [R09.02] Hypokalemia [E87.6] Hypomagnesemia [E83.42] Influenza A [J10.1] Uncontrolled hypertension [I10] Acute respiratory failure with hypoxemia ([place]) [J96.01]  DISCHARGE DIAGNOSIS:  Active Problems:   Flu   SECONDARY DIAGNOSIS:   Past Medical History:  Diagnosis Date  . Allergy   . Chicken pox   . Depression   . Frequent headaches    otc med prn  . History of blood transfusion 07/2009   [place] - 2 units transfused  . Hypertension   . Sleep apnea    uses cpap nightly    HOSPITAL COURSE:   41 year old female with history of essential hypertension who presents with body aches and found to have influenza A.   1. Influenza A: Patient will continue treatment for influenza with Tamiflu for total 5 days.   2. Severe hypokalemia/magnesium: This is repleted  3. Essential hypertension: Due to her acute viral infection blood pressure was elevated. She will need to follow up with her PCP for blood pressure monitoring. She will continue on home dose of HCTZ  4. History of SLEEP apnea on CPAP  DISCHARGE CONDITIONS AND DIET:   Stable for discharge on heart healthy diet  CONSULTS OBTAINED:    DRUG ALLERGIES:   Allergies  Allergen Reactions  . Latex Hives  . Prednisone Swelling, Rash and Other (See Comments)    Elevated blood pressure    DISCHARGE MEDICATIONS:   Current Discharge Medication List    START taking these medications   Details  oseltamivir (TAMIFLU) 75 MG capsule Take 1 capsule (75 mg total) by mouth 2 (two) times daily. Qty: 6 capsule, Refills: 0      CONTINUE these medications which have NOT CHANGED   Details   Butalbital-APAP-Caffeine 50-300-40 MG CAPS Take 1 tablet by mouth daily as needed. Qty: 30 capsule, Refills: 0    etonogestrel (NEXPLANON) 68 MG IMPL implant 1 each by Subdermal route once. Inserted 10/08/2014    fluticasone (FLONASE) 50 MCG/ACT nasal spray Place 1 spray into both nostrils daily as needed for allergies.  Refills: 0    hydrochlorothiazide (HYDRODIURIL) 25 MG tablet Take 1 tablet (25 mg total) by mouth daily. MUST SCHEDULE ANNUAL PHYSICAL FOR MORE REFILLS Qty: 30 tablet, Refills: 1    oxymetazoline (AFRIN) 0.05 % nasal spray Place 2 sprays into both nostrils 2 (two) times daily as needed for congestion.    sertraline (ZOLOFT) 50 MG tablet TAKE 1 TABLET (50 MG TOTAL) BY MOUTH DAILY. NO MORE REFILLS WITHOUT ANNUAL EXAM VISIT Qty: 30 tablet, Refills: 0    potassium chloride SA (K-DUR,KLOR-CON) 20 MEQ tablet Take 1 tablet (20 mEq total) by mouth 2 (two) times daily. Qty: 60 tablet, Refills: 2      STOP taking these medications     ibuprofen (ADVIL,MOTRIN) 200 MG tablet               Today   CHIEF COMPLAINT:  Patient doing better this morning. Still with some mild body aches   VITAL SIGNS:  Blood pressure (!) 160/99, pulse 84, temperature 99 F (37.2 C), temperature source Oral, resp. rate 17, height 5\' 2"  (1.575 m), weight 92.1 kg (203 lb), SpO2 96 %.   REVIEW OF  SYSTEMS:  Review of Systems  Constitutional: Positive for malaise/fatigue. Negative for chills and fever.  HENT: Negative.  Negative for ear discharge, ear pain, hearing loss, nosebleeds and sore throat.   Eyes: Negative.  Negative for blurred vision and pain.  Respiratory: Positive for cough. Negative for hemoptysis, shortness of breath and wheezing.   Cardiovascular: Negative.  Negative for chest pain, palpitations and leg swelling.  Gastrointestinal: Negative.  Negative for abdominal pain, blood in stool, diarrhea, nausea and vomiting.  Genitourinary: Negative.  Negative for dysuria.   Musculoskeletal: Negative.  Negative for back pain.  Skin: Negative.   Neurological: Negative for dizziness, tremors, speech change, focal weakness, seizures and headaches.  Endo/Heme/Allergies: Negative.  Does not bruise/bleed easily.  Psychiatric/Behavioral: Negative.  Negative for depression, hallucinations and suicidal ideas.     PHYSICAL EXAMINATION:  GENERAL:  41 y.o.-year-old patient lying in the bed with no acute distress.  NECK:  Supple, no jugular venous distention. No thyroid enlargement, no tenderness.  LUNGS: Normal breath sounds bilaterally, no wheezing, rales,rhonchi  No use of accessory muscles of respiration.  CARDIOVASCULAR: S1, S2 normal. No murmurs, rubs, or gallops.  ABDOMEN: Soft, non-tender, non-distended. Bowel sounds present. No organomegaly or mass.  EXTREMITIES: No pedal edema, cyanosis, or clubbing.  PSYCHIATRIC: The patient is alert and oriented x 3.  SKIN: No obvious rash, lesion, or ulcer.   DATA REVIEW:   CBC  Recent Labs Lab 08/06/16 0457  WBC 8.3  HGB 13.8  HCT 40.7  PLT 313    Chemistries   Recent Labs Lab 08/07/16 0325  NA 133*  K 3.3*  CL 103  CO2 25  GLUCOSE 155*  BUN 7  CREATININE 0.72  CALCIUM 7.9*  MG 1.9    Cardiac Enzymes  Recent Labs Lab 08/06/16 0024  TROPONINI <0.03    Microbiology Results  @[email or web address] @  RADIOLOGY:  Dg Chest 2 View  Result Date: 08/06/2016 CLINICAL DATA:  Hypertension, chest discomfort EXAM: CHEST  2 VIEW COMPARISON:  None. FINDINGS: The heart size and mediastinal contours are within normal limits. No pulmonary consolidations. Mild bronchitic change may explain the slight increase in interstitial prominence noted bilaterally. No effusion or pneumothorax. The visualized skeletal structures are unremarkable. IMPRESSION: Mild increase in interstitial prominence may reflect bronchitic change. Otherwise negative exam. Electronically Signed   By: Ashley Royalty M.D.   On: 08/06/2016 00:44       Management plans discussed with the patient and she is in agreement. Stable for discharge home  Patient should follow up with pcp  CODE STATUS:     Code Status Orders        Start     Ordered   08/06/16 0436  Full code  Continuous     08/06/16 0435    Code Status History    Date Active Date Inactive Code Status Order ID Comments User Context   This patient has a current code status but no historical code status.      TOTAL TIME TAKING CARE OF THIS PATIENT: 37 minutes.    Note: This dictation was prepared with Dragon dictation along with smaller phrase technology. Any transcriptional errors that result from this process are unintentional.  Jenevie Casstevens M.D on 08/07/2016 at 8:44 AM  Between 7am to 6pm - Pager - [phone] After 6pm go to [email or web address] - password [email or web address] [facility]  Office  [phone]  CC: Primary care physician; Webb Silversmith, NP

## 2016-08-07 NOTE — Progress Notes (Signed)
[place] at [place] was admitted to the Hospital on 08/06/2016 and Discharged  08/07/2016 and should be excused from work/school   for 5 days starting 08/06/2016 , may return to work/school without any restrictions.  Call Bettey Costa MD with questions.  Arline Ketter M.D on 08/07/2016,at 12:09 PM  [place] at [facility]  [phone]

## 2016-08-10 ENCOUNTER — Ambulatory Visit (INDEPENDENT_AMBULATORY_CARE_PROVIDER_SITE_OTHER): Payer: 59 | Admitting: Internal Medicine

## 2016-08-10 ENCOUNTER — Encounter: Payer: Self-pay | Admitting: Internal Medicine

## 2016-08-10 VITALS — BP 138/98 | HR 77 | Temp 98.0°F | Wt 204.0 lb

## 2016-08-10 DIAGNOSIS — J111 Influenza due to unidentified influenza virus with other respiratory manifestations: Secondary | ICD-10-CM | POA: Diagnosis not present

## 2016-08-10 MED ORDER — HYDROCODONE-HOMATROPINE 5-1.5 MG/5ML PO SYRP: 5.0000 mL | ORAL_SOLUTION | Freq: Three times a day (TID) | ORAL | 0 refills | Status: DC | PRN

## 2016-08-10 NOTE — Progress Notes (Signed)
Subjective:    Patient ID: Martha Randall, female    DOB: Dec 14, 1975, 41 y.o.   MRN: [id number]  HPI  Pt presents to the clinic today for hospital follow up. Pt was admitted 08/06/2016 with hypoxia, hypokalemia, hypomagnesia and elevated blood pressure. Chest xray was negative for pneumonia. She was diagnosed with the flu. Her electrolytes were replaced. She was continued on her home HCTZ and advised to follow up with her PCP. She was discharged on 08/07/16. Since discharge, she reports She still has a sore throat, cough and chest congestion, but she has noticed improvement. Her cough is productive of yellow mucous. She denies shortness of breath. Her main complaint is that she is not getting any sleep at night due to coughing. She  Is using her CPAP.  Review of Systems      Past Medical History:  Diagnosis Date  . Allergy   . Chicken pox   . Depression   . Frequent headaches    otc med prn  . History of blood transfusion 07/2009   [place] - 2 units transfused  . Hypertension   . Sleep apnea    uses cpap nightly    Current Outpatient Prescriptions  Medication Sig Dispense Refill  . Butalbital-APAP-Caffeine 50-300-40 MG CAPS Take 1 tablet by mouth daily as needed. 30 capsule 0  . etonogestrel (NEXPLANON) 68 MG IMPL implant 1 each by Subdermal route once. Inserted 10/08/2014    . fluticasone (FLONASE) 50 MCG/ACT nasal spray Place 1 spray into both nostrils daily as needed for allergies.   0  . hydrochlorothiazide (HYDRODIURIL) 25 MG tablet Take 1 tablet (25 mg total) by mouth daily. MUST SCHEDULE ANNUAL PHYSICAL FOR MORE REFILLS 30 tablet 1  . HYDROcodone-homatropine (HYCODAN) 5-1.5 MG/5ML syrup Take 5 mLs by mouth every 8 (eight) hours as needed for cough. 120 mL 0  . oxymetazoline (AFRIN) 0.05 % nasal spray Place 2 sprays into both nostrils 2 (two) times daily as needed for congestion.    . potassium chloride SA (K-DUR,KLOR-CON) 20 MEQ tablet Take 1 tablet (20 mEq total) by mouth  2 (two) times daily. (Patient not taking: Reported on 08/06/2016) 60 tablet 2  . sertraline (ZOLOFT) 50 MG tablet TAKE 1 TABLET (50 MG TOTAL) BY MOUTH DAILY. NO MORE REFILLS WITHOUT ANNUAL EXAM VISIT 30 tablet 0   No current facility-administered medications for this visit.     Allergies  Allergen Reactions  . Latex Hives  . Prednisone Swelling, Rash and Other (See Comments)    Elevated blood pressure    Family History  Problem Relation Age of Onset  . Hypertension Mother   . Arthritis Maternal Aunt   . Arthritis Maternal Grandmother   . Cancer Maternal Grandmother     Breast  . Hypertension Maternal Grandmother     Social History   Social History  . Marital status: Married    Spouse name: N/A  . Number of children: N/A  . Years of education: N/A   Occupational History  . works for [employer] in [place] History Main Topics  . Smoking status: Never Smoker  . Smokeless tobacco: Never Used  . Alcohol use 0.6 oz/week    1 Glasses of wine per week     Comment: occasional wine  . Drug use: No  . Sexual activity: Yes    Birth control/ protection: IUD   Other Topics Concern  . Not on file   Social History Narrative  . No  narrative on file     Constitutional: Pt reports fatigue. Denies fever, malaise, headache or abrupt weight changes.  HEENT: Pt reports sore throat. Denies eye pain, eye redness, ear pain, ringing in the ears, wax buildup, runny nose, nasal congestion, bloody nose. Respiratory: Pt reports cough. Denies difficulty breathing, shortness of breath, or sputum production.   Cardiovascular: Denies chest pain, chest tightness, palpitations or swelling in the hands or feet.   No other specific complaints in a complete review of systems (except as listed in HPI above).   Objective:   Physical Exam  BP (!) 138/98   Pulse 77   Temp 98 F (36.7 C) (Oral)   Wt 204 lb (92.5 kg)   SpO2 98%   BMI 37.31 kg/m  Wt Readings from Last 3  Encounters:  08/10/16 204 lb (92.5 kg)  08/06/16 203 lb (92.1 kg)  01/17/15 201 lb (91.2 kg)    General: Appears her stated age, in NAD. HEENT: Head: normal shape and size, no sinus tenderness noted;  Throat/Mouth: Teeth present, mucosa erythematous and moist, no exudate, lesions or ulcerations noted.  Neck:  No adenopathy noted Cardiovascular: Normal rate and rhythm. S1,S2 noted.  Pulmonary/Chest: Normal effort and positive vesicular breath sounds. No respiratory distress. No wheezes, rales or ronchi noted.   BMET    Component Value Date/Time   NA 133 (L) 08/07/2016 0325   K 3.3 (L) 08/07/2016 0325   CL 103 08/07/2016 0325   CO2 25 08/07/2016 0325   GLUCOSE 155 (H) 08/07/2016 0325   BUN 7 08/07/2016 0325   CREATININE 0.72 08/07/2016 0325   CREATININE 0.93 01/17/2015 1524   CALCIUM 7.9 (L) 08/07/2016 0325   GFRNONAA >60 08/07/2016 0325   GFRNONAA >60 02/14/2014 1205   GFRAA >60 08/07/2016 0325   GFRAA >60 02/14/2014 1205    Lipid Panel     Component Value Date/Time   CHOL 187 07/15/2015 0823   TRIG 69.0 07/15/2015 0823   HDL 53.20 07/15/2015 0823   CHOLHDL 4 07/15/2015 0823   VLDL 13.8 07/15/2015 0823   LDLCALC 120 (H) 07/15/2015 0823    CBC    Component Value Date/Time   WBC 8.3 08/06/2016 0457   RBC 5.02 08/06/2016 0457   HGB 13.8 08/06/2016 0457   HCT 40.7 08/06/2016 0457   PLT 313 08/06/2016 0457   MCV 81.1 08/06/2016 0457   MCH 27.5 08/06/2016 0457   MCHC 33.9 08/06/2016 0457   RDW 13.1 08/06/2016 0457    Hgb A1C Lab Results  Component Value Date   HGBA1C 6.4 07/15/2015            Assessment & Plan:   Hospital follow up for Influenza:  Hospital notes, labs and imaging reviewed She completed a course of Tamiflu Encouraged her to continue to rest and drink plenty of fluids Ibuprofen for sore throat RX for Hycodan for cough  RTC in 3 weeks for your annual exam and to follow up on BP BAITY, REGINA, NP

## 2016-08-10 NOTE — Patient Instructions (Signed)

## 2016-09-13 ENCOUNTER — Ambulatory Visit (INDEPENDENT_AMBULATORY_CARE_PROVIDER_SITE_OTHER): Payer: 59 | Admitting: Internal Medicine

## 2016-09-13 ENCOUNTER — Encounter: Payer: Self-pay | Admitting: Internal Medicine

## 2016-09-13 VITALS — BP 142/92 | HR 74 | Temp 98.1°F | Ht 63.0 in | Wt 206.0 lb

## 2016-09-13 DIAGNOSIS — J301 Allergic rhinitis due to pollen: Secondary | ICD-10-CM | POA: Diagnosis not present

## 2016-09-13 DIAGNOSIS — F329 Major depressive disorder, single episode, unspecified: Secondary | ICD-10-CM

## 2016-09-13 DIAGNOSIS — R519 Headache, unspecified: Secondary | ICD-10-CM

## 2016-09-13 DIAGNOSIS — J4520 Mild intermittent asthma, uncomplicated: Secondary | ICD-10-CM | POA: Diagnosis not present

## 2016-09-13 DIAGNOSIS — Z0000 Encounter for general adult medical examination without abnormal findings: Secondary | ICD-10-CM | POA: Diagnosis not present

## 2016-09-13 DIAGNOSIS — Z23 Encounter for immunization: Secondary | ICD-10-CM

## 2016-09-13 DIAGNOSIS — I10 Essential (primary) hypertension: Secondary | ICD-10-CM

## 2016-09-13 DIAGNOSIS — R51 Headache: Secondary | ICD-10-CM | POA: Diagnosis not present

## 2016-09-13 DIAGNOSIS — G4733 Obstructive sleep apnea (adult) (pediatric): Secondary | ICD-10-CM

## 2016-09-13 DIAGNOSIS — E669 Obesity, unspecified: Secondary | ICD-10-CM

## 2016-09-13 MED ORDER — SERTRALINE HCL 100 MG PO TABS: 100.0000 mg | ORAL_TABLET | Freq: Every day | ORAL | 3 refills | Status: DC

## 2016-09-13 MED ORDER — AMLODIPINE BESYLATE 10 MG PO TABS: 10.0000 mg | ORAL_TABLET | Freq: Every day | ORAL | 0 refills | Status: DC

## 2016-09-13 NOTE — Assessment & Plan Note (Signed)
Not well controlled Stop HCTZ and potassium supplement Start Amlodipine 10 mg daily Encouraged exercise for weight loss

## 2016-09-13 NOTE — Assessment & Plan Note (Signed)
Continue Afrin/Flonase as needed

## 2016-09-13 NOTE — Assessment & Plan Note (Signed)
Currently not an issue Will monitor 

## 2016-09-13 NOTE — Patient Instructions (Signed)

## 2016-09-13 NOTE — Assessment & Plan Note (Signed)
Continue Fioricet as needed 

## 2016-09-13 NOTE — Progress Notes (Signed)
Subjective:    Patient ID: Martha Randall, female    DOB: 10/03/1975, 41 y.o.   MRN: [id number]  HPI  Pt presents to the clinic today for her annual exam. She is also due to follow up chronic conditions.  Seasonal Allergies: Worse in the spring. She takes Afrin/Flonase as needed with good relief.  Asthma: This is currently not an issue. She does not use an inhaler.  Depression: Triggered by life stress. She is taking Zoloft and does not feel like it is controlling her symptoms well. She thinks her medication needs to be adjusted.  Frequent Headaches: She reports they have improved. They occur about 4 x month. She takes Fioricet as needed with good relief.   HTN: Her BP today is 142/72. Her blood pressure at home ranges 140/90's. She is taking HCTZ along with a potassium supplement. ECG from 07/2016 reviewed.  OSA: She sleeps well with her CPAP. She averages 3-4 hours of sleep per night. She denies daytime fatigue.   Flu: 04/2015 Tetanus: unsure Mammogram: 01/2016 Pap Smear: 01/2016 Vision Screening: as needed Dentist: biannually  Diet: She does eat some meat. She consumes fruits and veggies daily. She tries to avoid fried foods.   She drinks mostly water. Exercise: None  Review of Systems      Past Medical History:  Diagnosis Date  . Allergy   . Chicken pox   . Depression   . Frequent headaches    otc med prn  . History of blood transfusion 07/2009   [place] - 2 units transfused  . Hypertension   . Sleep apnea    uses cpap nightly    Current Outpatient Prescriptions  Medication Sig Dispense Refill  . Butalbital-APAP-Caffeine 50-300-40 MG CAPS Take 1 tablet by mouth daily as needed. 30 capsule 0  . etonogestrel (NEXPLANON) 68 MG IMPL implant 1 each by Subdermal route once. Inserted 10/08/2014    . fluticasone (FLONASE) 50 MCG/ACT nasal spray Place 1 spray into both nostrils daily as needed for allergies.   0  . hydrochlorothiazide (HYDRODIURIL) 25 MG tablet Take  1 tablet (25 mg total) by mouth daily. MUST SCHEDULE ANNUAL PHYSICAL FOR MORE REFILLS 30 tablet 1  . HYDROcodone-homatropine (HYCODAN) 5-1.5 MG/5ML syrup Take 5 mLs by mouth every 8 (eight) hours as needed for cough. 120 mL 0  . oxymetazoline (AFRIN) 0.05 % nasal spray Place 2 sprays into both nostrils 2 (two) times daily as needed for congestion.    . potassium chloride SA (K-DUR,KLOR-CON) 20 MEQ tablet Take 1 tablet (20 mEq total) by mouth 2 (two) times daily. (Patient not taking: Reported on 08/06/2016) 60 tablet 2  . sertraline (ZOLOFT) 50 MG tablet TAKE 1 TABLET (50 MG TOTAL) BY MOUTH DAILY. NO MORE REFILLS WITHOUT ANNUAL EXAM VISIT 30 tablet 0   No current facility-administered medications for this visit.     Allergies  Allergen Reactions  . Latex Hives  . Prednisone Swelling, Rash and Other (See Comments)    Elevated blood pressure    Family History  Problem Relation Age of Onset  . Hypertension Mother   . Arthritis Maternal Aunt   . Arthritis Maternal Grandmother   . Cancer Maternal Grandmother     Breast  . Hypertension Maternal Grandmother     Social History   Social History  . Marital status: Married    Spouse name: N/A  . Number of children: N/A  . Years of education: N/A   Occupational History  . works for  [employer] in [employer]    Social History Main Topics  . Smoking status: Never Smoker  . Smokeless tobacco: Never Used  . Alcohol use 0.6 oz/week    1 Glasses of wine per week     Comment: occasional wine  . Drug use: No  . Sexual activity: Yes    Birth control/ protection: IUD   Other Topics Concern  . Not on file   Social History Narrative  . No narrative on file     Constitutional: Pt reports intermittent headaches, weight gain. Denies fever, malaise, fatigue.  HEENT: Pt reports nasal congestion. Denies eye pain, eye redness, ear pain, ringing in the ears, wax buildup, runny nose, bloody nose, or sore throat. Respiratory: Denies difficulty  breathing, shortness of breath, cough or sputum production.   Cardiovascular: Denies chest pain, chest tightness, palpitations or swelling in the hands or feet.  Gastrointestinal: Denies abdominal pain, bloating, constipation, diarrhea or blood in the stool.  GU: Pt reports urinary frequency. Denies urgency, pain with urination, burning sensation, blood in urine, odor or discharge. Musculoskeletal: Denies decrease in range of motion, difficulty with gait, muscle pain or joint pain and swelling.  Skin: Denies redness, rashes, lesions or ulcercations.  Neurological: Denies dizziness, difficulty with memory, difficulty with speech or problems with balance and coordination.  Psych: Pt reports history of depression. Denies anxiety, SI/HI.  No other specific complaints in a complete review of systems (except as listed in HPI above).  Objective:   Physical Exam   BP (!) 142/92   Pulse 74   Temp 98.1 F (36.7 C) (Oral)   Ht 5\' 3"  (1.6 m)   Wt 206 lb (93.4 kg)   SpO2 98%   BMI 36.49 kg/m  Wt Readings from Last 3 Encounters:  09/13/16 206 lb (93.4 kg)  08/10/16 204 lb (92.5 kg)  08/06/16 203 lb (92.1 kg)    General: Appears her stated age, obese in NAD. Skin: Warm, dry and intact. No rashes, lesions or ulcerations noted. HEENT: Head: normal shape and size; Eyes: sclera white, no icterus, conjunctiva pink, PERRLA and EOMs intact; Ears: Tm's gray and intact, normal light reflex; Nose: mucosa pink and moist, septum midline; Throat/Mouth: Teeth present, mucosa pink and moist, no exudate, lesions or ulcerations noted.  Neck:  Neck supple, trachea midline. No masses, lumps or thyromegaly present.  Cardiovascular: Normal rate and rhythm. S1,S2 noted.  No murmur, rubs or gallops noted. No JVD or BLE edema.  Pulmonary/Chest: Normal effort and positive vesicular breath sounds. No respiratory distress. No wheezes, rales or ronchi noted.  Abdomen: Soft and nontender. Normal bowel sounds. No distention  or masses noted. Liver, spleen and kidneys non palpable. Musculoskeletal: Strength 5/5 BUE/BLE. No difficulty with gait.  Neurological: Alert and oriented. Cranial nerves II-XII grossly intact. Coordination normal.  Psychiatric: Mood and affect normal. Behavior is normal. Judgment and thought content normal.     BMET    Component Value Date/Time   NA 133 (L) 08/07/2016 0325   K 3.3 (L) 08/07/2016 0325   CL 103 08/07/2016 0325   CO2 25 08/07/2016 0325   GLUCOSE 155 (H) 08/07/2016 0325   BUN 7 08/07/2016 0325   CREATININE 0.72 08/07/2016 0325   CREATININE 0.93 01/17/2015 1524   CALCIUM 7.9 (L) 08/07/2016 0325   GFRNONAA >60 08/07/2016 0325   GFRNONAA >60 02/14/2014 1205   GFRAA >60 08/07/2016 0325   GFRAA >60 02/14/2014 1205    Lipid Panel     Component Value Date/Time  CHOL 187 07/15/2015 0823   TRIG 69.0 07/15/2015 0823   HDL 53.20 07/15/2015 0823   CHOLHDL 4 07/15/2015 0823   VLDL 13.8 07/15/2015 0823   LDLCALC 120 (H) 07/15/2015 0823    CBC    Component Value Date/Time   WBC 8.3 08/06/2016 0457   RBC 5.02 08/06/2016 0457   HGB 13.8 08/06/2016 0457   HCT 40.7 08/06/2016 0457   PLT 313 08/06/2016 0457   MCV 81.1 08/06/2016 0457   MCH 27.5 08/06/2016 0457   MCHC 33.9 08/06/2016 0457   RDW 13.1 08/06/2016 0457    Hgb A1C Lab Results  Component Value Date   HGBA1C 6.4 07/15/2015           Assessment & Plan:   Preventative Health Maintenance:  Flu shot today She declines tetanus vaccine today Mammogram and pap smear UTD Encouraged her to consume a balanced diet and exercise regimen- gave her info on the [facility] Advised her to see an eye doctor and dentist annually Will check CBC, CMET, Lipid, TSH and A1C today  RTC in 3 weeks for HTN follow up Webb Silversmith, NP

## 2016-09-13 NOTE — Assessment & Plan Note (Signed)
Info given on [facility]

## 2016-09-13 NOTE — Assessment & Plan Note (Signed)
Encouraged her to continue to wear her CPAP Discussed how weight loss might help improve her OSA

## 2016-09-13 NOTE — Assessment & Plan Note (Signed)
Deteriorated Support offered today Increase Zoloft to 100 mg daily

## 2016-09-14 LAB — COMPREHENSIVE METABOLIC PANEL
ALT: 13 U/L (ref 0–35)
AST: 15 U/L (ref 0–37)
Albumin: 4 g/dL (ref 3.5–5.2)
Alkaline Phosphatase: 99 U/L (ref 39–117)
BUN: 10 mg/dL (ref 6–23)
CO2: 29 mEq/L (ref 19–32)
Calcium: 9.6 mg/dL (ref 8.4–10.5)
Chloride: 98 mEq/L (ref 96–112)
Creatinine, Ser: 0.89 mg/dL (ref 0.40–1.20)
GFR: 89.53 mL/min (ref >60.00)
Glucose, Bld: 78 mg/dL (ref 70–99)
Potassium: 3.3 mEq/L — ABNORMAL LOW (ref 3.5–5.1)
Sodium: 138 mEq/L (ref 135–145)
Total Bilirubin: 0.6 mg/dL (ref 0.2–1.2)
Total Protein: 7.7 g/dL (ref 6.0–8.3)

## 2016-09-14 LAB — LIPID PANEL
Cholesterol: 187 mg/dL (ref 0–200)
HDL: 55.4 mg/dL (ref >39.00)
LDL Cholesterol: 116 mg/dL — ABNORMAL HIGH (ref 0–99)
NonHDL: 131.25
Total CHOL/HDL Ratio: 3
Triglycerides: 74 mg/dL (ref 0.0–149.0)
VLDL: 14.8 mg/dL (ref 0.0–40.0)

## 2016-09-14 LAB — HEMOGLOBIN A1C: Hgb A1c MFr Bld: 7.3 % — ABNORMAL HIGH (ref 4.6–6.5)

## 2016-09-14 LAB — CBC
HCT: 41.8 % (ref 36.0–46.0)
Hemoglobin: 14.2 g/dL (ref 12.0–15.0)
MCHC: 33.9 g/dL (ref 30.0–36.0)
MCV: 81 fl (ref 78.0–100.0)
Platelets: 441 10*3/uL — ABNORMAL HIGH (ref 150.0–400.0)
RBC: 5.15 Mil/uL — ABNORMAL HIGH (ref 3.87–5.11)
RDW: 13.2 % (ref 11.5–15.5)
WBC: 11 10*3/uL — ABNORMAL HIGH (ref 4.0–10.5)

## 2016-09-14 LAB — TSH: TSH: 1.4 u[IU]/mL (ref 0.35–4.50)

## 2016-10-04 ENCOUNTER — Encounter: Payer: Self-pay | Admitting: Internal Medicine

## 2016-10-04 ENCOUNTER — Ambulatory Visit (INDEPENDENT_AMBULATORY_CARE_PROVIDER_SITE_OTHER): Payer: 59 | Admitting: Internal Medicine

## 2016-10-04 DIAGNOSIS — E119 Type 2 diabetes mellitus without complications: Secondary | ICD-10-CM | POA: Diagnosis not present

## 2016-10-04 DIAGNOSIS — I10 Essential (primary) hypertension: Secondary | ICD-10-CM | POA: Diagnosis not present

## 2016-10-04 MED ORDER — AMLODIPINE BESYLATE 10 MG PO TABS: 10.0000 mg | ORAL_TABLET | Freq: Every day | ORAL | 10 refills | Status: DC

## 2016-10-04 NOTE — Progress Notes (Signed)
Subjective:    Patient ID: Martha Randall, female    DOB: 24-Jun-1976, 41 y.o.   MRN: [id number]  HPI  Pt presents to the clinic today for 3 week follow up of HTN. At her last visit, her HCTZ and potassium were stopped. She was started on Amlodipine 10 mg. She has been taking the medication as prescribed. She denies adverse side effects. Her BP today is 122/78.  She is also here to discuss new onset diabetes. Her A1C was 7.3%. She has increased more vegetables, cut down on her carbs, and cut back on her meat intake. She is not currently exercising. She does have a family history of diabetes.   Review of Systems      Past Medical History:  Diagnosis Date  . Allergy   . Chicken pox   . Depression   . Frequent headaches    otc med prn  . History of blood transfusion 07/2009   [place] - 2 units transfused  . Hypertension   . Sleep apnea    uses cpap nightly    Current Outpatient Prescriptions  Medication Sig Dispense Refill  . amLODipine (NORVASC) 10 MG tablet Take 1 tablet (10 mg total) by mouth daily. 30 tablet 0  . Butalbital-APAP-Caffeine 50-300-40 MG CAPS Take 1 tablet by mouth daily as needed. 30 capsule 0  . etonogestrel (NEXPLANON) 68 MG IMPL implant 1 each by Subdermal route once. Inserted 10/08/2014    . fluticasone (FLONASE) 50 MCG/ACT nasal spray Place 1 spray into both nostrils daily as needed for allergies.   0  . oxymetazoline (AFRIN) 0.05 % nasal spray Place 2 sprays into both nostrils 2 (two) times daily as needed for congestion.    . potassium chloride SA (K-DUR,KLOR-CON) 20 MEQ tablet Take 1 tablet (20 mEq total) by mouth 2 (two) times daily. 60 tablet 2  . sertraline (ZOLOFT) 100 MG tablet Take 1 tablet (100 mg total) by mouth daily. 30 tablet 3   No current facility-administered medications for this visit.     Allergies  Allergen Reactions  . Latex Hives  . Prednisone Swelling, Rash and Other (See Comments)    Elevated blood pressure    Family  History  Problem Relation Age of Onset  . Hypertension Mother   . Arthritis Maternal Aunt   . Arthritis Maternal Grandmother   . Cancer Maternal Grandmother     Breast  . Hypertension Maternal Grandmother     Social History   Social History  . Marital status: Married    Spouse name: N/A  . Number of children: N/A  . Years of education: N/A   Occupational History  . works for [employer] in [place] History Main Topics  . Smoking status: Never Smoker  . Smokeless tobacco: Never Used  . Alcohol use 0.6 oz/week    1 Glasses of wine per week     Comment: occasional wine  . Drug use: No  . Sexual activity: Yes    Birth control/ protection: IUD   Other Topics Concern  . Not on file   Social History Narrative  . No narrative on file     Constitutional: Denies fever, malaise, fatigue, headache or abrupt weight changes.  Respiratory: Denies difficulty breathing, shortness of breath, cough or sputum production.   Cardiovascular: Denies chest pain, chest tightness, palpitations or swelling in the hands or feet.  Neurological: Denies dizziness, difficulty with memory, difficulty with speech or problems with balance and coordination.  No other specific complaints in a complete review of systems (except as listed in HPI above).  Objective:   Physical Exam   BP 122/78   Pulse 78   Temp 98 F (36.7 C) (Oral)   Wt 205 lb (93 kg)   SpO2 98%   BMI 36.31 kg/m  Wt Readings from Last 3 Encounters:  10/04/16 205 lb (93 kg)  09/13/16 206 lb (93.4 kg)  08/10/16 204 lb (92.5 kg)    General: Appears her stated age, obese in NAD. Skin: Warm, dry and intact. No ulcerations noted. Cardiovascular: Normal rate and rhythm.  Pulmonary/Chest: Normal effort and positive vesicular breath sounds. No respiratory distress. No wheezes, rales or ronchi noted.  Neurological: Alert and oriented.    BMET    Component Value Date/Time   NA 138 09/13/2016 1611   K 3.3 (L)  09/13/2016 1611   CL 98 09/13/2016 1611   CO2 29 09/13/2016 1611   GLUCOSE 78 09/13/2016 1611   BUN 10 09/13/2016 1611   CREATININE 0.89 09/13/2016 1611   CREATININE 0.93 01/17/2015 1524   CALCIUM 9.6 09/13/2016 1611   GFRNONAA >60 08/07/2016 0325   GFRNONAA >60 02/14/2014 1205   GFRAA >60 08/07/2016 0325   GFRAA >60 02/14/2014 1205    Lipid Panel     Component Value Date/Time   CHOL 187 09/13/2016 1611   TRIG 74.0 09/13/2016 1611   HDL 55.40 09/13/2016 1611   CHOLHDL 3 09/13/2016 1611   VLDL 14.8 09/13/2016 1611   LDLCALC 116 (H) 09/13/2016 1611    CBC    Component Value Date/Time   WBC 11.0 (H) 09/13/2016 1611   RBC 5.15 (H) 09/13/2016 1611   HGB 14.2 09/13/2016 1611   HCT 41.8 09/13/2016 1611   PLT 441.0 (H) 09/13/2016 1611   MCV 81.0 09/13/2016 1611   MCH 27.5 08/06/2016 0457   MCHC 33.9 09/13/2016 1611   RDW 13.2 09/13/2016 1611    Hgb A1C Lab Results  Component Value Date   HGBA1C 7.3 (H) 09/13/2016           Assessment & Plan:

## 2016-10-04 NOTE — Patient Instructions (Signed)
Diabetes Mellitus and Standards of Medical Care Managing diabetes (diabetes mellitus) can be complicated. Your diabetes treatment may be managed by a team of health care providers, including:  A diet and nutrition specialist (registered dietitian).  A nurse.  A certified diabetes educator (CDE).  A diabetes specialist (endocrinologist).  An eye doctor.  A primary care provider.  A dentist. Your health care providers follow a schedule in order to help you get the best quality of care. The following schedule is a general guideline for your diabetes management plan. Your health care providers may also give you more specific instructions. HbA1c ( hemoglobin A1c) test This test provides information about blood sugar (glucose) control over the previous 2-3 months. It is used to check whether your diabetes management plan needs to be adjusted.  If you are meeting your treatment goals, this test is done at least 2 times a year.  If you are not meeting treatment goals or if your treatment goals have changed, this test is done 4 times a year. Blood pressure test  This test is done at every routine medical visit. For most people, the goal is less than 130/80. Ask your health care provider what your goal blood pressure should be. Dental and eye exams  Visit your dentist two times a year.  If you have type 1 diabetes, get an eye exam 3-5 years after you are diagnosed, and then once a year after your first exam.  If you were diagnosed with type 1 diabetes as a child, get an eye exam when you are age 10 or older and have had diabetes for 3-5 years. After the first exam, you should get an eye exam once a year.  If you have type 2 diabetes, have an eye exam as soon as you are diagnosed, and then once a year after your first exam. Foot care exam  Visual foot exams are done at every routine medical visit. The exams check for cuts, bruises, redness, blisters, sores, or other problems with the  feet.  A complete foot exam is done by your health care provider once a year. This exam includes an inspection of the structure and skin of your feet, and a check of the pulses and sensation in your feet.  Type 1 diabetes: Get your first exam 3-5 years after diagnosis.  Type 2 diabetes: Get your first exam as soon as you are diagnosed.  Check your feet every day for cuts, bruises, redness, blisters, or sores. If you have any of these or other problems that are not healing, contact your health care provider. Kidney function test ( urine microalbumin)  This test is done once a year.  Type 1 diabetes: Get your first test 5 years after diagnosis.  Type 2 diabetes: Get your first test as soon as you are diagnosed.  If you have chronic kidney disease (CKD), get a serum creatinine and estimated glomerular filtration rate (eGFR) test once a year. Lipid profile (cholesterol, HDL, LDL, triglycerides)  This test should be done when you are diagnosed with diabetes, and every 5 years after the first test. If you are on medicines to lower your cholesterol, you may need to get this test done every year.  The goal for LDL is less than 100 mg/dL (5.5 mmol/L). If you are at high risk, the goal is less than 70 mg/dL (3.9 mmol/L).    The goal for triglycerides is less than 150 mg/dL (8.3 mmol/L). Immunizations  The yearly flu (influenza) vaccine is   recommended for everyone 6 months or older who has diabetes.  The pneumonia (pneumococcal) vaccine is recommended for everyone 2 years or older who has diabetes. If you are 61 or older, you may get the pneumonia vaccine as a series of two separate shots.  The hepatitis B vaccine is recommended for adults shortly after they have been diagnosed with diabetes.     Mental and emotional health  Screening for symptoms of eating disorders, anxiety, and depression is recommended at the time of diagnosis and afterward as needed. If your screening shows that  you have symptoms (you have a positive screening result), you may need further evaluation and be referred to a mental health care provider. Diabetes self-management education  Education about how to manage your diabetes is recommended at diagnosis and ongoing as needed. Treatment plan  Your treatment plan will be reviewed at every medical visit. Summary  Managing diabetes (diabetes mellitus) can be complicated. Your diabetes treatment may be managed by a team of health care providers.  Your health care providers follow a schedule in order to help you get the best quality of care.  Standards of care including having regular physical exams, blood tests, blood pressure monitoring, immunizations, screening tests, and education about how to manage your diabetes.  Your health care providers may also give you more specific instructions based on your individual health. This information is not intended to replace advice given to you by your health care provider. Make sure you discuss any questions you have with your health care provider. Document Released: 05/02/2009 Document Revised: 04/02/2016 Document Reviewed: 04/02/2016 Elsevier Interactive Patient Education  2017 [employer].

## 2016-10-04 NOTE — Assessment & Plan Note (Signed)
At goal Continue Amlodipine Refilled today

## 2016-10-04 NOTE — Assessment & Plan Note (Signed)
Discussed diabetes and standards of medical care She would like to take 3 months to work on lifestyle changes She is not interested in starting Metformin at this time Discussed low carb diet and exercise for weight loss  RTC in 3 months for lab only A1C

## 2016-10-26 DIAGNOSIS — G43719 Chronic migraine without aura, intractable, without status migrainosus: Secondary | ICD-10-CM | POA: Diagnosis not present

## 2016-10-29 ENCOUNTER — Telehealth: Payer: Self-pay | Admitting: Internal Medicine

## 2016-10-29 ENCOUNTER — Encounter: Payer: Self-pay | Admitting: Family Medicine

## 2016-10-29 ENCOUNTER — Ambulatory Visit (INDEPENDENT_AMBULATORY_CARE_PROVIDER_SITE_OTHER): Payer: 59 | Admitting: Family Medicine

## 2016-10-29 VITALS — BP 116/76 | HR 88 | Temp 98.5°F | Wt 206.5 lb

## 2016-10-29 DIAGNOSIS — D473 Essential (hemorrhagic) thrombocythemia: Secondary | ICD-10-CM | POA: Diagnosis not present

## 2016-10-29 DIAGNOSIS — E119 Type 2 diabetes mellitus without complications: Secondary | ICD-10-CM

## 2016-10-29 DIAGNOSIS — R351 Nocturia: Secondary | ICD-10-CM

## 2016-10-29 DIAGNOSIS — R079 Chest pain, unspecified: Secondary | ICD-10-CM | POA: Diagnosis not present

## 2016-10-29 DIAGNOSIS — E876 Hypokalemia: Secondary | ICD-10-CM | POA: Diagnosis not present

## 2016-10-29 DIAGNOSIS — D75839 Thrombocytosis, unspecified: Secondary | ICD-10-CM

## 2016-10-29 LAB — CBC WITH DIFFERENTIAL/PLATELET
Basophils Absolute: 0 cells/uL (ref 0–200)
Basophils Relative: 0 %
Eosinophils Absolute: 109 cells/uL (ref 15–500)
Eosinophils Relative: 1 %
HCT: 41.9 % (ref 35.0–45.0)
Hemoglobin: 14 g/dL (ref 11.7–15.5)
Lymphocytes Relative: 35 %
Lymphs Abs: 3815 cells/uL (ref 850–3900)
MCH: 27.3 pg (ref 27.0–33.0)
MCHC: 33.4 g/dL (ref 32.0–36.0)
MCV: 81.7 fL (ref 80.0–100.0)
MPV: 9.4 fL (ref 7.5–12.5)
Monocytes Absolute: 545 cells/uL (ref 200–950)
Monocytes Relative: 5 %
Neutro Abs: 6431 cells/uL (ref 1500–7800)
Neutrophils Relative %: 59 %
Platelets: 472 10*3/uL — ABNORMAL HIGH (ref 140–400)
RBC: 5.13 MIL/uL — ABNORMAL HIGH (ref 3.80–5.10)
RDW: 14 % (ref 11.0–15.0)
WBC: 10.9 10*3/uL — ABNORMAL HIGH (ref 3.8–10.8)

## 2016-10-29 LAB — POTASSIUM: Potassium: 3.6 mmol/L (ref 3.5–5.3)

## 2016-10-29 NOTE — Telephone Encounter (Signed)
[facility] Patient Name: Martha Randall DOB: 1975/07/30 Initial Comment Caller states she's having chest pains, some back pains. Nurse Assessment Nurse: Markus Daft, RN, Sherre Poot Date/Time (Eastern Time): 10/29/2016 11:00:58 AM Confirm and document reason for call. If symptomatic, describe symptoms. ---Caller states she's having chest pains and radiates to upper back pains. Started out like "pings" on the right side of her chest. Today she is sitting at desk and has 2 pains on left side that last 1-2 seconds. S/S for last week daily on the right side. She has a stressful job especially in last 2 wks. Does the patient have any new or worsening symptoms? ---Yes Will a triage be completed? ---Yes Related visit to physician within the last 2 weeks? ---No Does the PT have any chronic conditions? (i.e. diabetes, asthma, etc.) ---Yes List chronic conditions. ---HTN, Type 2 DM, Migraines Is the patient pregnant or possibly pregnant? (Ask all females between the ages of 52-55) ---No Is this a behavioral health or substance abuse call? ---No Guidelines Guideline Title Affirmed Question Affirmed Notes Chest Pain [1] Chest pain(s) lasting a few seconds AND [2] persists > 3 days Final Disposition User See PCP When Office is Open (within 3 days) Markus Daft, Therapist, sports, Sherre Poot Comments 3:30 pm appt set up with Ria Bush (booked by office). She is aware if it worsens to go to ER. - Caller notified, and verb. understanding. Disagree/Comply: Comply

## 2016-10-29 NOTE — Progress Notes (Signed)
BP 116/76   Pulse 88   Temp 98.5 F (36.9 C) (Oral)   Wt 206 lb 8 oz (93.7 kg)   SpO2 97% Comment: RA  BMI 36.58 kg/m    CC: chest pain  Subjective:    Patient ID: Martha Randall, female    DOB: 03-21-1976, 41 y.o.   MRN: [id number]  HPI: Martha Randall is a 41 y.o. female presenting on 10/29/2016 for Chest Pain (off an on x1 week on right side; then today on left)   2 wks ago noticed painful acne breakouts. Also noticing increased nocturia. This week feeling slight chest discomfort "twinges" on right side of chest, occurring while sedentary or laying in bed in evenings. Some radiation to back. This morning pain traveled to left side of chest, only lasting a few seconds at a time. Some nausea yesterday and mild headache today. Chronic dry cough present.   Denies fevers/chills, viral URI sxs, dyspnea or palpitations, GERD symptoms, abd pain. Denies inciting trauma/injury.  Increased stress over last 2 weeks at home and at work - works in [employer], end of fiscal year.   HTN - compliant with amlodipine 12m daily with good effect.  Recent new onset diabetes with A1c 7.3% treating with lifestyle changes.  OSA on nightly CPAP.  Childhood asthma - grew out of this.   No CAD in family.  Non smoker.   Relevant past medical, surgical, family and social history reviewed and updated as indicated. Interim medical history since our last visit reviewed. Allergies and medications reviewed and updated. Outpatient Medications Prior to Visit  Medication Sig Dispense Refill  . amLODipine (NORVASC) 10 MG tablet Take 1 tablet (10 mg total) by mouth daily. 30 tablet 10  . etonogestrel (NEXPLANON) 68 MG IMPL implant 1 each by Subdermal route once. Inserted 10/08/2014    . fluticasone (FLONASE) 50 MCG/ACT nasal spray Place 1 spray into both nostrils daily as needed for allergies.   0  . sertraline (ZOLOFT) 100 MG tablet Take 1 tablet (100 mg total) by mouth daily. 30 tablet 3  .  Butalbital-APAP-Caffeine 50-300-40 MG CAPS Take 1 tablet by mouth daily as needed. (Patient not taking: Reported on 10/29/2016) 30 capsule 0  . oxymetazoline (AFRIN) 0.05 % nasal spray Place 2 sprays into both nostrils 2 (two) times daily as needed for congestion.     No facility-administered medications prior to visit.      Per HPI unless specifically indicated in ROS section below Review of Systems     Objective:    BP 116/76   Pulse 88   Temp 98.5 F (36.9 C) (Oral)   Wt 206 lb 8 oz (93.7 kg)   SpO2 97% Comment: RA  BMI 36.58 kg/m   Wt Readings from Last 3 Encounters:  10/29/16 206 lb 8 oz (93.7 kg)  10/04/16 205 lb (93 kg)  09/13/16 206 lb (93.4 kg)    Physical Exam  Constitutional: She appears well-developed and well-nourished. No distress.  HENT:  Mouth/Throat: Oropharynx is clear and moist. No oropharyngeal exudate.  Eyes: Conjunctivae are normal. Pupils are equal, round, and reactive to light.  Neck: Normal range of motion. Neck supple. No thyromegaly present.  Cardiovascular: Normal rate, regular rhythm, normal heart sounds and intact distal pulses.   No murmur heard. Pulmonary/Chest: Effort normal and breath sounds normal. No respiratory distress. She has no wheezes. She has no rales. She exhibits no tenderness.  No reproducible tenderness to palpation of chest wall.  Musculoskeletal: She  exhibits no edema.  Skin: Skin is warm and dry. No rash noted.  Psychiatric: She has a normal mood and affect. Her behavior is normal. Thought content normal.  Nursing note and vitals reviewed.  Results for orders placed or performed in visit on 10/29/16  CBC with Differential/Platelet  Result Value Ref Range   WBC 10.9 (H) 3.8 - 10.8 K/uL   RBC 5.13 (H) 3.80 - 5.10 MIL/uL   Hemoglobin 14.0 11.7 - 15.5 g/dL   HCT 41.9 35.0 - 45.0 %   MCV 81.7 80.0 - 100.0 fL   MCH 27.3 27.0 - 33.0 pg   MCHC 33.4 32.0 - 36.0 g/dL   RDW 14.0 11.0 - 15.0 %   Platelets 472 (H) 140 - 400 K/uL     MPV 9.4 7.5 - 12.5 fL   Neutro Abs 6,431 1,500 - 7,800 cells/uL   Lymphs Abs 3,815 850 - 3,900 cells/uL   Monocytes Absolute 545 200 - 950 cells/uL   Eosinophils Absolute 109 15 - 500 cells/uL   Basophils Absolute 0 0 - 200 cells/uL   Neutrophils Relative % 59 %   Lymphocytes Relative 35 %   Monocytes Relative 5 %   Eosinophils Relative 1 %   Basophils Relative 0 %   Smear Review Criteria for review not met   Troponin I  Result Value Ref Range   Troponin I <0.01 <=0.05 ng/mL  Potassium  Result Value Ref Range   Potassium 3.6 3.5 - 5.3 mmol/L   Lab Results  Component Value Date   TSH 1.40 09/13/2016    EKG - NSR rate 90s, normal axis, borderline short PR, ST flattening inferiolaterally, good R wave progression, overall unchanged from EKG 07/2016.    Assessment & Plan:  Over 25 minutes were spent face-to-face with the patient during this encounter and >50% of that time was spent on counseling and coordination of care. Urinalysis ordered but not obtained. Will ask her to return for this.  Problem List Items Addressed This Visit    Chest pain - Primary    Overall very atypical chest pain but unclear etiology - anticipate non cardiac source. ?MSK vs neuritis given short duration and sharp nature vs anxiety related (increased stress recently). zoloft recently increased to 189m daily. She is on hormonal medication but doubt PE. Not consistent with pulm cause, GERD.  Will further evaluate with labs - TnI, CBC, potassium (recent hypokalemia), UA today Given DM history - check EKG today. I don't appreciate acute ischemic changes. Rec gentle stretches, tylenol, heating pad. Update if persistent sxs for further evaluation.       Relevant Orders   EKG 12-Lead (Completed)   CBC with Differential/Platelet (Completed)   Troponin I (Completed)   DM (diabetes mellitus), type 2 [place]    Recent dx. Treating with diet control. Has f/u planned with PCP.       Thrombocytosis (HCC)    Update  CBC - mild thrombocytosis recently. If persistent, consider periph smear next visit.        Other Visit Diagnoses    Hypokalemia       Relevant Orders   Potassium (Completed)   Nocturia           Follow up plan: Return if symptoms worsen or fail to improve.  JRia Bush MD

## 2016-10-29 NOTE — Patient Instructions (Addendum)
Urinalysis today.  Blood work today. EKG today.  We will be in touch with results. I think this is likely more musculosckeletal pain or nerve irritation pain. Gentle stretches to chest wall and back.  May use tylenol and heating pad as needed.  Let us know if not improving or worsening symptoms.

## 2016-10-29 NOTE — Progress Notes (Signed)
Pre visit review using our clinic review tool, if applicable. No additional management support is needed unless otherwise documented below in the visit note. 

## 2016-10-30 LAB — TROPONIN I: Troponin I: <0.01 ng/mL (ref <=0.05)

## 2016-10-31 DIAGNOSIS — D75839 Thrombocytosis, unspecified: Secondary | ICD-10-CM | POA: Insufficient documentation

## 2016-10-31 DIAGNOSIS — R079 Chest pain, unspecified: Secondary | ICD-10-CM | POA: Insufficient documentation

## 2016-10-31 DIAGNOSIS — D473 Essential (hemorrhagic) thrombocythemia: Secondary | ICD-10-CM | POA: Insufficient documentation

## 2016-10-31 NOTE — Assessment & Plan Note (Addendum)
Overall very atypical chest pain but unclear etiology - anticipate non cardiac source. ?MSK vs neuritis given short duration and sharp nature vs anxiety related (increased stress recently). zoloft recently increased to 100mg  daily. She is on hormonal medication but doubt PE. Not consistent with pulm cause, GERD.  Will further evaluate with labs - TnI, CBC, potassium (recent hypokalemia), UA today Given DM history - check EKG today. I don't appreciate acute ischemic changes. Rec gentle stretches, tylenol, heating pad. Update if persistent sxs for further evaluation.

## 2016-10-31 NOTE — Assessment & Plan Note (Addendum)
Update CBC - mild thrombocytosis recently. If persistent, consider periph smear next visit.

## 2016-10-31 NOTE — Assessment & Plan Note (Signed)
Recent dx. Treating with diet control. Has f/u planned with PCP.

## 2016-11-01 ENCOUNTER — Telehealth (INDEPENDENT_AMBULATORY_CARE_PROVIDER_SITE_OTHER): Payer: 59 | Admitting: Family Medicine

## 2016-11-01 DIAGNOSIS — R351 Nocturia: Secondary | ICD-10-CM

## 2016-11-01 NOTE — Telephone Encounter (Signed)
Patient returned Kim's call about her lab results.  Patient said she'll drop off the UA sometime this week and she'll call us before she comes so she can be put on the lab schedule.

## 2016-11-01 NOTE — Telephone Encounter (Signed)
Great! Thanks Carrie! 

## 2016-11-02 ENCOUNTER — Other Ambulatory Visit: Payer: 59

## 2016-11-02 LAB — POC URINALSYSI DIPSTICK (AUTOMATED)
Bilirubin, UA: NEGATIVE
Glucose, UA: NEGATIVE
Ketones, UA: NEGATIVE
Leukocytes, UA: NEGATIVE
Nitrite, UA: NEGATIVE
Protein, UA: NEGATIVE
Spec Grav, UA: >=1.03 — AB (ref 1.010–1.025)
Urobilinogen, UA: 0.2 E.U./dL
pH, UA: 6 (ref 5.0–8.0)

## 2016-11-02 NOTE — Addendum Note (Signed)
Addended by: Royann Shivers A on: 11/02/2016 02:56 PM   Modules accepted: Orders

## 2016-11-02 NOTE — Telephone Encounter (Signed)
UA completed. Results in chart. Spun and drawn for UCx.

## 2016-11-02 NOTE — Telephone Encounter (Signed)
Pt called, spoke with Morey Hummingbird and is coming today to give urine. I'll let Maudie Mercury know when she leaves specimen.

## 2016-11-02 NOTE — Telephone Encounter (Signed)
See results note. No need for Ucx.

## 2016-12-29 ENCOUNTER — Encounter: Payer: Self-pay | Admitting: *Deleted

## 2016-12-29 ENCOUNTER — Emergency Department
Admission: EM | Admit: 2016-12-29 | Discharge: 2016-12-30 | Disposition: A | Discharge status: Home / Self Care | Payer: 59 | Attending: Student in an Organized Health Care Education/Training Program | Admitting: Student in an Organized Health Care Education/Training Program

## 2016-12-29 ENCOUNTER — Emergency Department: Payer: 59

## 2016-12-29 DIAGNOSIS — I10 Essential (primary) hypertension: Secondary | ICD-10-CM | POA: Insufficient documentation

## 2016-12-29 DIAGNOSIS — J45909 Unspecified asthma, uncomplicated: Secondary | ICD-10-CM | POA: Insufficient documentation

## 2016-12-29 DIAGNOSIS — M79602 Pain in left arm: Secondary | ICD-10-CM | POA: Diagnosis not present

## 2016-12-29 DIAGNOSIS — R079 Chest pain, unspecified: Secondary | ICD-10-CM | POA: Diagnosis not present

## 2016-12-29 DIAGNOSIS — E119 Type 2 diabetes mellitus without complications: Secondary | ICD-10-CM | POA: Insufficient documentation

## 2016-12-29 DIAGNOSIS — Z91040 Latex allergy status: Secondary | ICD-10-CM | POA: Diagnosis not present

## 2016-12-29 DIAGNOSIS — M549 Dorsalgia, unspecified: Secondary | ICD-10-CM | POA: Diagnosis not present

## 2016-12-29 LAB — TROPONIN I: Troponin I: <0.03 ng/mL (ref <0.03)

## 2016-12-29 LAB — CBC
HCT: 41.5 % (ref 35.0–47.0)
Hemoglobin: 14.1 g/dL (ref 12.0–16.0)
MCH: 27.3 pg (ref 26.0–34.0)
MCHC: 33.9 g/dL (ref 32.0–36.0)
MCV: 80.6 fL (ref 80.0–100.0)
Platelets: 415 10*3/uL (ref 150–440)
RBC: 5.15 MIL/uL (ref 3.80–5.20)
RDW: 13.2 % (ref 11.5–14.5)
WBC: 12.3 10*3/uL — ABNORMAL HIGH (ref 3.6–11.0)

## 2016-12-29 LAB — BASIC METABOLIC PANEL
Anion gap: 7 (ref 5–15)
BUN: 10 mg/dL (ref 6–20)
CO2: 28 mmol/L (ref 22–32)
Calcium: 9.2 mg/dL (ref 8.9–10.3)
Chloride: 104 mmol/L (ref 101–111)
Creatinine, Ser: 0.83 mg/dL (ref 0.44–1.00)
GFR calc Af Amer: >60 mL/min (ref >60)
GFR calc non Af Amer: >60 mL/min (ref >60)
Glucose, Bld: 122 mg/dL — ABNORMAL HIGH (ref 65–99)
Potassium: 3.1 mmol/L — ABNORMAL LOW (ref 3.5–5.1)
Sodium: 139 mmol/L (ref 135–145)

## 2016-12-29 NOTE — ED Triage Notes (Signed)
Pt has left arm pain radiaiting into neck and back.  Sx began today.  No chest pain or sob.  Pt denies n/v,  Pt reports feeling tired.  Pt alert.  Speech clear.

## 2016-12-29 NOTE — ED Provider Notes (Signed)
[facility] Emergency Department Provider Note    None    (approximate)  I have reviewed the triage vital signs and the nursing notes.   HISTORY  Chief Complaint Arm Pain    HPI Martha Randall is a 41 y.o. female presents with left arm achiness radiating into her neck and back that started earlier this morning while at work. Denies any chest pain or shortness of breath. No nausea or vomiting. No numbness or tingling. States that she's been working significant increase in her shortness due to deadlines coming up over the next few days. States that several other coworkers have been checked out in the ER the past several weeks with chest pains. She denies any fevers. States his symptoms were worsened by thinking about work. She currently has no discomfort at this time.  Does feel tired and states that she's had decreased sleep due to increased workload.   Past Medical History:  Diagnosis Date  . Allergy   . Chicken pox   . Depression   . Frequent headaches    otc med prn  . History of blood transfusion 07/2009   [place] - 2 units transfused  . Hypertension   . Sleep apnea    uses cpap nightly   Family History  Problem Relation Age of Onset  . Hypertension Mother   . Arthritis Maternal Aunt   . Arthritis Maternal Grandmother   . Cancer Maternal Grandmother        Breast  . Hypertension Maternal Grandmother    Past Surgical History:  Procedure Laterality Date  . BREAST BIOPSY Right 2004   lump is tagged  . CESAREAN SECTION     x 1  . HYSTEROSCOPY N/A 10/08/2014   Procedure: HYSTEROSCOPIC IUD Removal/Nexplanon Insertion;  Surgeon: Servando Salina, MD;  Location: [facility];  Service: Gynecology;  Laterality: N/A;  45 min.  Marland Kitchen HYSTEROSCOPY W/D&C N/A 09/26/2014   Procedure: DILATATION AND CURETTAGE /HYSTEROSCOPY/Hysteroscopic IUD Removal, NEXPLANON INSERTION ;  Surgeon: Servando Salina, MD;  Location: [facility];  Service: Gynecology;  Laterality:  N/A;  . SEPTOPLASTY    . WISDOM TOOTH EXTRACTION     Patient Active Problem List   Diagnosis Date Noted  . Chest pain 10/31/2016  . Thrombocytosis ([place]) 10/31/2016  . DM (diabetes mellitus), type 2 ([place]) 10/04/2016  . OSA (obstructive sleep apnea) 01/17/2015  . HTN (hypertension) 01/28/2014  . Obesity (BMI 30-39.9) 12/17/2013  . Depression 12/17/2013  . Frequent headaches 07/22/2010  . Allergic rhinitis 07/22/2010  . Asthma 07/22/2010      Prior to Admission medications   Medication Sig Start Date End Date Taking? Authorizing Provider  amLODipine (NORVASC) 10 MG tablet Take 1 tablet (10 mg total) by mouth daily. 10/04/16   Jearld Fenton, NP  Butalbital-APAP-Caffeine 50-300-40 MG CAPS Take 1 tablet by mouth daily as needed. Patient not taking: Reported on 10/29/2016 01/17/15   Jearld Fenton, NP  etonogestrel (NEXPLANON) 68 MG IMPL implant 1 each by Subdermal route once. Inserted 10/08/2014    [provider]  fluticasone (FLONASE) 50 MCG/ACT nasal spray Place 1 spray into both nostrils daily as needed for allergies.  07/08/14   [provider]  oxymetazoline (AFRIN) 0.05 % nasal spray Place 2 sprays into both nostrils 2 (two) times daily as needed for congestion.    [provider]  sertraline (ZOLOFT) 100 MG tablet Take 1 tablet (100 mg total) by mouth daily. 09/13/16   Jearld Fenton, NP  Allergies Latex and Prednisone    Social History Social History  Substance Use Topics  . Smoking status: Never Smoker  . Smokeless tobacco: Never Used  . Alcohol use 0.6 oz/week    1 Glasses of wine per week     Comment: occasional wine    Review of Systems Patient denies headaches, rhinorrhea, blurry vision, numbness, shortness of breath, chest pain, edema, cough, abdominal pain, nausea, vomiting, diarrhea, dysuria, fevers, rashes or hallucinations unless otherwise stated above in HPI. ____________________________________________   PHYSICAL  EXAM:  VITAL SIGNS: Vitals:   12/29/16 2126  BP: (!) 176/119  Pulse: 88  Resp: 18  Temp: 98.9 F (37.2 C)    Constitutional: Alert and oriented. Well appearing and in no acute distress. Eyes: Conjunctivae are normal.  Head: Atraumatic. Nose: No congestion/rhinnorhea. Mouth/Throat: Mucous membranes are moist.   Neck: No stridor. Painless ROM.  Cardiovascular: Normal rate, regular rhythm. Grossly normal heart sounds.  Good peripheral circulation.  Pulses equal and strong bilaterally Respiratory: Normal respiratory effort.  No retractions. Lungs CTAB. Gastrointestinal: Soft and nontender. No distention. No abdominal bruits. No CVA tenderness. Genitourinary:  Musculoskeletal: No lower extremity tenderness nor edema.  No joint effusions. Neurologic:  Normal speech and language. No gross focal neurologic deficits are appreciated. No facial droop Skin:  Skin is warm, dry and intact. No rash noted. Psychiatric: anxious but Speech and behavior are normal.  ____________________________________________   LABS (all labs ordered are listed, but only abnormal results are displayed)  Results for orders placed or performed during the hospital encounter of 12/29/16 (from the past 24 hour(s))  Basic metabolic panel     Status: Abnormal   Collection Time: 12/29/16  9:23 PM  Result Value Ref Range   Sodium 139 135 - 145 mmol/L   Potassium 3.1 (L) 3.5 - 5.1 mmol/L   Chloride 104 101 - 111 mmol/L   CO2 28 22 - 32 mmol/L   Glucose, Bld 122 (H) 65 - 99 mg/dL   BUN 10 6 - 20 mg/dL   Creatinine, Ser 0.83 0.44 - 1.00 mg/dL   Calcium 9.2 8.9 - 10.3 mg/dL   GFR calc non Af Amer >60 >60 mL/min   GFR calc Af Amer >60 >60 mL/min   Anion gap 7 5 - 15  CBC     Status: Abnormal   Collection Time: 12/29/16  9:23 PM  Result Value Ref Range   WBC 12.3 (H) 3.6 - 11.0 K/uL   RBC 5.15 3.80 - 5.20 MIL/uL   Hemoglobin 14.1 12.0 - 16.0 g/dL   HCT 41.5 35.0 - 47.0 %   MCV 80.6 80.0 - 100.0 fL   MCH 27.3  26.0 - 34.0 pg   MCHC 33.9 32.0 - 36.0 g/dL   RDW 13.2 11.5 - 14.5 %   Platelets 415 150 - 440 K/uL  Troponin I     Status: None   Collection Time: 12/29/16  9:23 PM  Result Value Ref Range   Troponin I <0.03 <0.03 ng/mL   ____________________________________________  EKG My review and personal interpretation at Time: 21:29   Indication: left arm pain  Rate: 80  Rhythm: sinus Axis: normal Other: non specific t wave changs, no stemi ____________________________________________  RADIOLOGY  I personally reviewed all radiographic images ordered to evaluate for the above acute complaints and reviewed radiology reports and findings.  These findings were personally discussed with the patient.  Please see medical record for radiology report.  ____________________________________________   PROCEDURES  Procedure(s) performed:  Procedures    Critical Care performed: no ____________________________________________   INITIAL IMPRESSION / ASSESSMENT AND PLAN / ED COURSE  Pertinent labs & imaging results that were available during my care of the patient were reviewed by me and considered in my medical decision making (see chart for details).  DDX: ACS, pericarditis, esophagitis, boerhaaves, pe, dissection, pna, bronchitis, costochondritis   Martha Randall is a 41 y.o. who presents to the ED with left arm pain radiating in the back as described above. Patient afebrile and hypertensive but otherwise well appearing and in no acute distress.  Seems less consistent with ACS the patient does have risk factors. Patient with a heart score of 2 based on risk factors but EKG and troponin initially negative. We'll repeat troponin to further risk stratify. This is not clinically consistent with dissection or pulmonary embolism. Patient is low risk Wells's criteria and is PERC negative.  The patient will be placed on continuous pulse oximetry and telemetry for monitoring.  Laboratory  evaluation will be sent to evaluate for the above complaints.      patient remains hemodynamic stable and chest pain-free. Repeat troponin is negative. At this point due to the patient is stable for follow-up as an outpatient with cardiology and her PCP.  Have discussed with the patient and available family all diagnostics and treatments performed thus far and all questions were answered to the best of my ability. The patient demonstrates understanding and agreement with plan.    ____________________________________________   FINAL CLINICAL IMPRESSION(S) / ED DIAGNOSES  Final diagnoses:  Left arm pain  Chest pain, unspecified type      NEW MEDICATIONS STARTED DURING THIS VISIT:  New Prescriptions   No medications on file     Note:  This document was prepared using Dragon voice recognition software and may include unintentional dictation errors.    Merlyn Lot, MD 12/30/16 [phone]

## 2016-12-30 ENCOUNTER — Telehealth: Payer: Self-pay

## 2016-12-30 LAB — TROPONIN I: Troponin I: <0.03 ng/mL (ref <0.03)

## 2016-12-30 MED ORDER — DIAZEPAM 5 MG PO TABS: 5.0000 mg | ORAL_TABLET | Freq: Every evening | ORAL | 0 refills | Status: AC | PRN

## 2016-12-30 NOTE — ED Notes (Signed)

## 2016-12-30 NOTE — Telephone Encounter (Signed)
Pt was seen [facility] ED on 12/29/16 for lt arm pain and CP. Pt had blood test done at ED and pt wants to know if needs to come for lab appt on 01/03/17. Pt is going to schedule appt with Dr Serafina Royals as instructed at ED. Pt request cb about lab appt.

## 2016-12-31 NOTE — Telephone Encounter (Signed)
Pt has appt for Tues and canceled lab only appt

## 2016-12-31 NOTE — Telephone Encounter (Signed)
Potassium was low. She needs an ER follow up

## 2016-12-31 NOTE — Telephone Encounter (Signed)
Potassium was 3.1

## 2017-01-03 ENCOUNTER — Other Ambulatory Visit: Payer: 59

## 2017-01-04 ENCOUNTER — Telehealth: Payer: Self-pay | Admitting: Internal Medicine

## 2017-01-04 ENCOUNTER — Ambulatory Visit: Payer: 59 | Admitting: Internal Medicine

## 2017-01-04 DIAGNOSIS — Z0289 Encounter for other administrative examinations: Secondary | ICD-10-CM

## 2017-01-04 NOTE — Telephone Encounter (Signed)
Patient did not come in for their appointment today for hospital follow up.  Please let me know if patient needs to be contacted immediately for follow up or no follow up needed. Do you want to charge the NSF?  Pt came in near 2pm, she thought her appointment was at 2.  She did not reschedule as she is going out of town soon.

## 2017-01-04 NOTE — Telephone Encounter (Signed)
Yes she should reschedule and yes charge the no show fee

## 2017-01-04 NOTE — Progress Notes (Deleted)
Subjective:    Patient ID: Martha Randall, female    DOB: 30-Oct-1975, 41 y.o.   MRN: [id number]  HPI  Pt presents to the clinic today for ER follow up. She went to the ER 6/13 with c/o chest pain that radiated into her back and left arm. ECG was normal. Chest xray was normal. Troponins were negative. They felt like this may be secondary to work related stress and advised her to follow up with her PCP. Since discharge, she reports.  Review of Systems  Past Medical History:  Diagnosis Date  . Allergy   . Chicken pox   . Depression   . Frequent headaches    otc med prn  . History of blood transfusion 07/2009   [place] - 2 units transfused  . Hypertension   . Sleep apnea    uses cpap nightly    Current Outpatient Prescriptions  Medication Sig Dispense Refill  . amLODipine (NORVASC) 10 MG tablet Take 1 tablet (10 mg total) by mouth daily. 30 tablet 10  . Butalbital-APAP-Caffeine 50-300-40 MG CAPS Take 1 tablet by mouth daily as needed. (Patient not taking: Reported on 10/29/2016) 30 capsule 0  . diazepam (VALIUM) 5 MG tablet Take 1 tablet (5 mg total) by mouth at bedtime as needed for anxiety or sedation. 5 tablet 0  . etonogestrel (NEXPLANON) 68 MG IMPL implant 1 each by Subdermal route once. Inserted 10/08/2014    . fluticasone (FLONASE) 50 MCG/ACT nasal spray Place 1 spray into both nostrils daily as needed for allergies.   0  . oxymetazoline (AFRIN) 0.05 % nasal spray Place 2 sprays into both nostrils 2 (two) times daily as needed for congestion.    . sertraline (ZOLOFT) 100 MG tablet Take 1 tablet (100 mg total) by mouth daily. 30 tablet 3   No current facility-administered medications for this visit.     Allergies  Allergen Reactions  . Latex Hives  . Prednisone Swelling, Rash and Other (See Comments)    Elevated blood pressure    Family History  Problem Relation Age of Onset  . Hypertension Mother   . Arthritis Maternal Aunt   . Arthritis Maternal Grandmother     . Cancer Maternal Grandmother        Breast  . Hypertension Maternal Grandmother     Social History   Social History  . Marital status: Married    Spouse name: N/A  . Number of children: N/A  . Years of education: N/A   Occupational History  . works for [employer] in [place] History Main Topics  . Smoking status: Never Smoker  . Smokeless tobacco: Never Used  . Alcohol use 0.6 oz/week    1 Glasses of wine per week     Comment: occasional wine  . Drug use: No  . Sexual activity: Yes    Birth control/ protection: Implant     Comment: nexplanon   Other Topics Concern  . Not on file   Social History Narrative  . No narrative on file     Constitutional: Denies fever, malaise, fatigue, headache or abrupt weight changes.  HEENT: Denies eye pain, eye redness, ear pain, ringing in the ears, wax buildup, runny nose, nasal congestion, bloody nose, or sore throat. Respiratory: Denies difficulty breathing, shortness of breath, cough or sputum production.   Cardiovascular: Denies chest pain, chest tightness, palpitations or swelling in the hands or feet.  Gastrointestinal: Denies abdominal pain, bloating, constipation, diarrhea or blood  in the stool.  GU: Denies urgency, frequency, pain with urination, burning sensation, blood in urine, odor or discharge. Musculoskeletal: Denies decrease in range of motion, difficulty with gait, muscle pain or joint pain and swelling.  Skin: Denies redness, rashes, lesions or ulcercations.  Neurological: Denies dizziness, difficulty with memory, difficulty with speech or problems with balance and coordination.  Psych: Denies anxiety, depression, SI/HI.  No other specific complaints in a complete review of systems (except as listed in HPI above).     Objective:   Physical Exam        Assessment & Plan:

## 2017-01-10 NOTE — Telephone Encounter (Signed)
Pt will call to schedule appt after she sees cardiology

## 2017-01-11 DIAGNOSIS — I208 Other forms of angina pectoris: Secondary | ICD-10-CM | POA: Diagnosis not present

## 2017-01-11 DIAGNOSIS — I10 Essential (primary) hypertension: Secondary | ICD-10-CM | POA: Diagnosis not present

## 2017-01-11 DIAGNOSIS — E782 Mixed hyperlipidemia: Secondary | ICD-10-CM | POA: Diagnosis not present

## 2017-02-15 DIAGNOSIS — I208 Other forms of angina pectoris: Secondary | ICD-10-CM | POA: Diagnosis not present

## 2017-02-21 DIAGNOSIS — I10 Essential (primary) hypertension: Secondary | ICD-10-CM | POA: Diagnosis not present

## 2017-02-21 DIAGNOSIS — E782 Mixed hyperlipidemia: Secondary | ICD-10-CM | POA: Diagnosis not present

## 2017-02-21 DIAGNOSIS — I208 Other forms of angina pectoris: Secondary | ICD-10-CM | POA: Diagnosis not present

## 2017-05-11 DIAGNOSIS — Z01419 Encounter for gynecological examination (general) (routine) without abnormal findings: Secondary | ICD-10-CM | POA: Diagnosis not present

## 2017-05-28 DIAGNOSIS — J3489 Other specified disorders of nose and nasal sinuses: Secondary | ICD-10-CM | POA: Diagnosis not present

## 2017-05-28 DIAGNOSIS — J069 Acute upper respiratory infection, unspecified: Secondary | ICD-10-CM | POA: Diagnosis not present

## 2017-07-20 DIAGNOSIS — Z23 Encounter for immunization: Secondary | ICD-10-CM | POA: Diagnosis not present

## 2017-08-08 DIAGNOSIS — Z719 Counseling, unspecified: Secondary | ICD-10-CM | POA: Diagnosis not present

## 2017-08-18 DIAGNOSIS — Z719 Counseling, unspecified: Secondary | ICD-10-CM | POA: Diagnosis not present

## 2017-08-21 DIAGNOSIS — J0100 Acute maxillary sinusitis, unspecified: Secondary | ICD-10-CM | POA: Diagnosis not present

## 2017-08-25 DIAGNOSIS — Z719 Counseling, unspecified: Secondary | ICD-10-CM | POA: Diagnosis not present

## 2017-08-25 DIAGNOSIS — E782 Mixed hyperlipidemia: Secondary | ICD-10-CM | POA: Diagnosis not present

## 2017-08-25 DIAGNOSIS — I208 Other forms of angina pectoris: Secondary | ICD-10-CM | POA: Diagnosis not present

## 2017-08-25 DIAGNOSIS — I10 Essential (primary) hypertension: Secondary | ICD-10-CM | POA: Diagnosis not present

## 2017-09-01 DIAGNOSIS — Z719 Counseling, unspecified: Secondary | ICD-10-CM | POA: Diagnosis not present

## 2017-09-08 DIAGNOSIS — Z719 Counseling, unspecified: Secondary | ICD-10-CM | POA: Diagnosis not present

## 2017-09-15 DIAGNOSIS — Z719 Counseling, unspecified: Secondary | ICD-10-CM | POA: Diagnosis not present

## 2017-09-28 DIAGNOSIS — N644 Mastodynia: Secondary | ICD-10-CM | POA: Diagnosis not present

## 2017-09-29 DIAGNOSIS — Z719 Counseling, unspecified: Secondary | ICD-10-CM | POA: Diagnosis not present

## 2017-10-06 DIAGNOSIS — Z719 Counseling, unspecified: Secondary | ICD-10-CM | POA: Diagnosis not present

## 2017-10-13 DIAGNOSIS — Z719 Counseling, unspecified: Secondary | ICD-10-CM | POA: Diagnosis not present

## 2017-10-20 DIAGNOSIS — Z719 Counseling, unspecified: Secondary | ICD-10-CM | POA: Diagnosis not present

## 2017-10-27 DIAGNOSIS — Z719 Counseling, unspecified: Secondary | ICD-10-CM | POA: Diagnosis not present

## 2017-10-31 DIAGNOSIS — R81 Glycosuria: Secondary | ICD-10-CM | POA: Diagnosis not present

## 2017-10-31 DIAGNOSIS — R350 Frequency of micturition: Secondary | ICD-10-CM | POA: Diagnosis not present

## 2017-11-03 DIAGNOSIS — Z719 Counseling, unspecified: Secondary | ICD-10-CM | POA: Diagnosis not present

## 2017-11-10 DIAGNOSIS — Z719 Counseling, unspecified: Secondary | ICD-10-CM | POA: Diagnosis not present

## 2017-11-30 DIAGNOSIS — E1165 Type 2 diabetes mellitus with hyperglycemia: Secondary | ICD-10-CM | POA: Diagnosis not present

## 2017-11-30 DIAGNOSIS — I10 Essential (primary) hypertension: Secondary | ICD-10-CM | POA: Diagnosis not present

## 2018-05-12 ENCOUNTER — Other Ambulatory Visit: Payer: Self-pay | Admitting: Internal Medicine

## 2018-05-30 ENCOUNTER — Other Ambulatory Visit: Payer: Self-pay | Admitting: Internal Medicine

## 2018-05-31 NOTE — Telephone Encounter (Signed)
I called [facility] and they stated the last time the Amlodipine was filled was Feb 2019 and pt picked up Rx that Dr Alain Marion called in last night

## 2018-05-31 NOTE — Telephone Encounter (Signed)
[facility] faxed note; pt out of amlodipine for 1 wk; 05/29/18 at night BP 165/122. Pt has not taken BP since then. Pt cannot take BP now; not at home. Pt said she has no symptoms now; on 05/30/18 had a feeling in arm but pt thinks that was her anxiety for being out of BP med. Pt said the [facility] nurse filled her amlodipine until seen 07/26/18 for CPX. Pt does not want to schedule appt now for BP ck until pt sees how BP does and how pt feels. FYI to Avie Echevaria NP. Copy of [facility] note sent for scanning and in R Baity NP in box.

## 2018-06-23 IMAGING — CR DG CHEST 2V
1 series · 2 of 2 positions shown · non-contrast
Comparison: None.

CLINICAL DATA: Hypertension, chest discomfort

EXAM:
CHEST  2 VIEW

[Series 1: dg chest 2 view · 0.14mm/px · 2 of 2 slices shown]
[im 1/2]
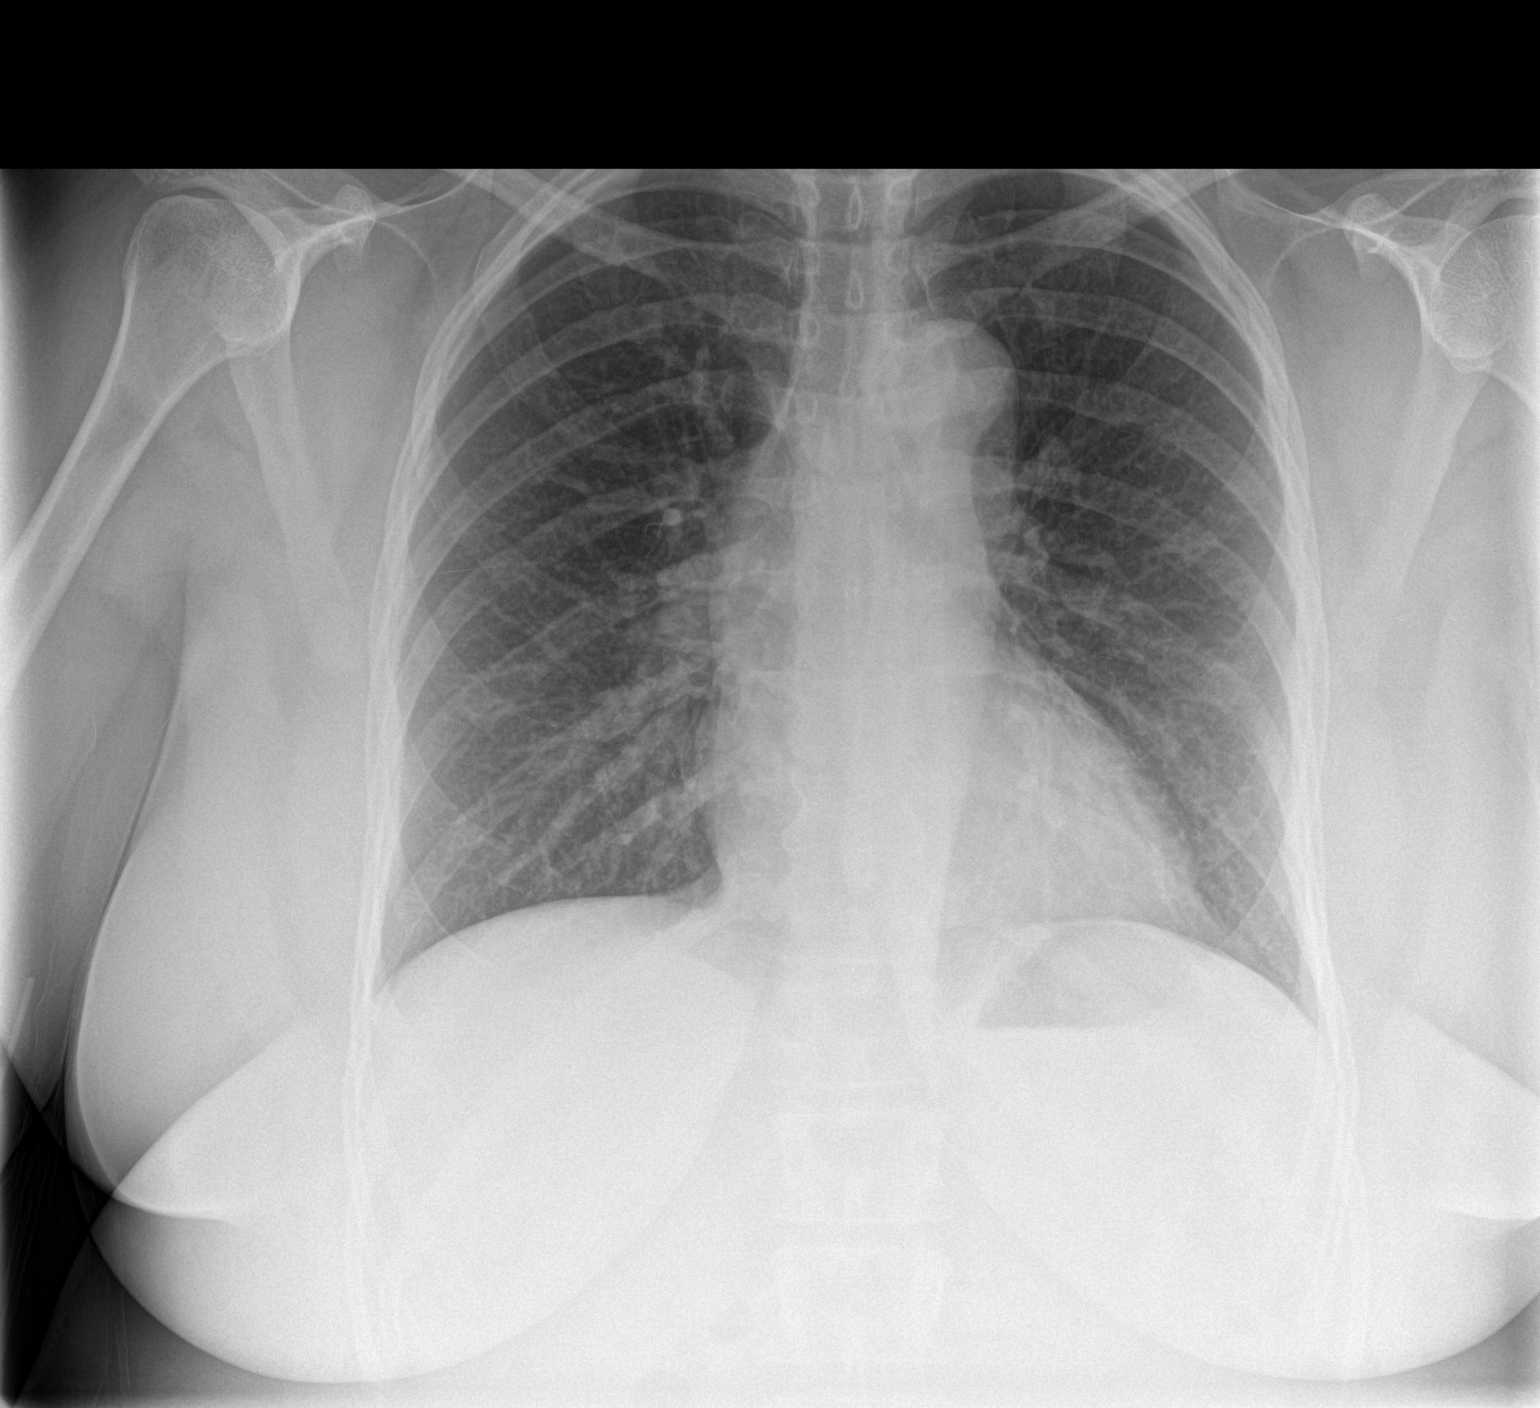
[im 2/2]
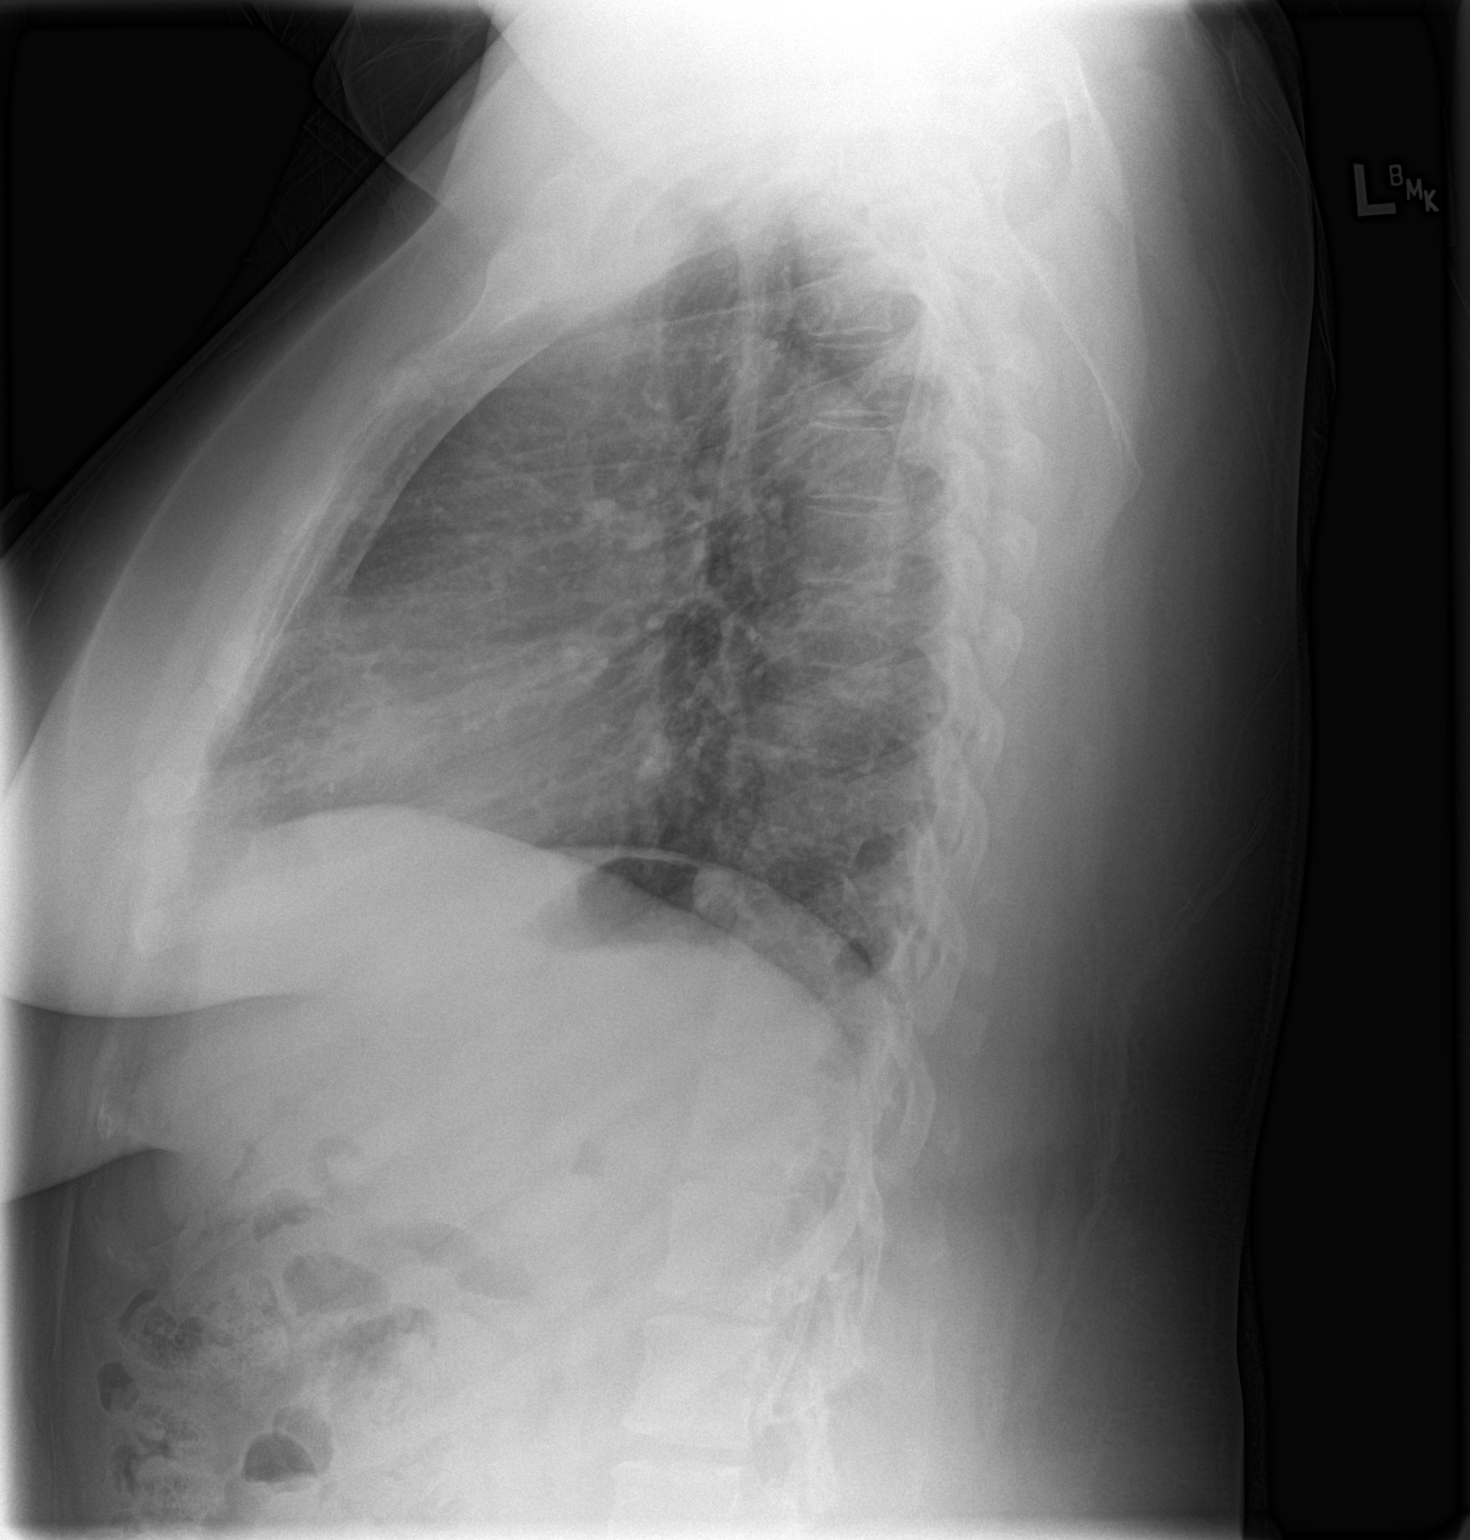

[2 of 2 positions shown; findings below may reference images not displayed]

FINDINGS: The heart size and mediastinal contours are within normal limits. No
pulmonary consolidations. Mild bronchitic change may explain the
slight increase in interstitial prominence noted bilaterally. No
effusion or pneumothorax. The visualized skeletal structures are
unremarkable.
IMPRESSION: Mild increase in interstitial prominence may reflect bronchitic
change. Otherwise negative exam.

## 2018-07-26 ENCOUNTER — Encounter: Payer: 59 | Admitting: Internal Medicine

## 2018-07-31 ENCOUNTER — Other Ambulatory Visit: Payer: Self-pay | Admitting: Internal Medicine

## 2018-08-01 ENCOUNTER — Ambulatory Visit (INDEPENDENT_AMBULATORY_CARE_PROVIDER_SITE_OTHER): Payer: 59 | Admitting: Internal Medicine

## 2018-08-01 ENCOUNTER — Encounter: Payer: Self-pay | Admitting: Internal Medicine

## 2018-08-01 VITALS — BP 138/90 | HR 80 | Temp 98.3°F | Ht 63.25 in | Wt 201.0 lb

## 2018-08-01 DIAGNOSIS — F329 Major depressive disorder, single episode, unspecified: Secondary | ICD-10-CM

## 2018-08-01 DIAGNOSIS — Z0000 Encounter for general adult medical examination without abnormal findings: Secondary | ICD-10-CM

## 2018-08-01 DIAGNOSIS — Z23 Encounter for immunization: Secondary | ICD-10-CM

## 2018-08-01 DIAGNOSIS — R519 Headache, unspecified: Secondary | ICD-10-CM

## 2018-08-01 DIAGNOSIS — R51 Headache: Secondary | ICD-10-CM

## 2018-08-01 DIAGNOSIS — J4520 Mild intermittent asthma, uncomplicated: Secondary | ICD-10-CM | POA: Diagnosis not present

## 2018-08-01 DIAGNOSIS — G4733 Obstructive sleep apnea (adult) (pediatric): Secondary | ICD-10-CM

## 2018-08-01 DIAGNOSIS — I10 Essential (primary) hypertension: Secondary | ICD-10-CM | POA: Diagnosis not present

## 2018-08-01 DIAGNOSIS — E119 Type 2 diabetes mellitus without complications: Secondary | ICD-10-CM

## 2018-08-01 LAB — COMPREHENSIVE METABOLIC PANEL
ALT: 12 U/L (ref 0–35)
AST: 13 U/L (ref 0–37)
Albumin: 3.7 g/dL (ref 3.5–5.2)
Alkaline Phosphatase: 84 U/L (ref 39–117)
BUN: 6 mg/dL (ref 6–23)
CO2: 26 mEq/L (ref 19–32)
Calcium: 8.8 mg/dL (ref 8.4–10.5)
Chloride: 104 mEq/L (ref 96–112)
Creatinine, Ser: 0.74 mg/dL (ref 0.40–1.20)
GFR: 109.79 mL/min (ref >60.00)
Glucose, Bld: 94 mg/dL (ref 70–99)
Potassium: 3.9 mEq/L (ref 3.5–5.1)
Sodium: 137 mEq/L (ref 135–145)
Total Bilirubin: 0.5 mg/dL (ref 0.2–1.2)
Total Protein: 7 g/dL (ref 6.0–8.3)

## 2018-08-01 LAB — VITAMIN D 25 HYDROXY (VIT D DEFICIENCY, FRACTURES): VITD: 13.99 ng/mL — ABNORMAL LOW (ref 30.00–100.00)

## 2018-08-01 LAB — CBC
HCT: 41 % (ref 36.0–46.0)
Hemoglobin: 14 g/dL (ref 12.0–15.0)
MCHC: 34.1 g/dL (ref 30.0–36.0)
MCV: 81 fl (ref 78.0–100.0)
Platelets: 422 10*3/uL — ABNORMAL HIGH (ref 150.0–400.0)
RBC: 5.05 Mil/uL (ref 3.87–5.11)
RDW: 13 % (ref 11.5–15.5)
WBC: 9.4 10*3/uL (ref 4.0–10.5)

## 2018-08-01 LAB — HEMOGLOBIN A1C: Hgb A1c MFr Bld: 7.1 % — ABNORMAL HIGH (ref 4.6–6.5)

## 2018-08-01 LAB — MICROALBUMIN / CREATININE URINE RATIO
Creatinine,U: 215.2 mg/dL
Microalb Creat Ratio: 13.1 mg/g (ref 0.0–30.0)
Microalb, Ur: 28.1 mg/dL — ABNORMAL HIGH (ref 0.0–1.9)

## 2018-08-01 LAB — LIPID PANEL
Cholesterol: 181 mg/dL (ref 0–200)
HDL: 54.3 mg/dL (ref >39.00)
LDL Cholesterol: 115 mg/dL — ABNORMAL HIGH (ref 0–99)
NonHDL: 127.09
Total CHOL/HDL Ratio: 3
Triglycerides: 59 mg/dL (ref 0.0–149.0)
VLDL: 11.8 mg/dL (ref 0.0–40.0)

## 2018-08-01 NOTE — Progress Notes (Signed)
Subjective:    Patient ID: Martha Randall, female    DOB: 01-11-1976, 43 y.o.   MRN: [id number]  HPI  Pt presents to the clinic today for her annual exam. She is also due to follow up chronic conditions.  Asthma: Currently not an issue.  She is not using any inhalers.  There is no spirometry on file.  OSA: She reports she is wearing her CPAP intermittently.  She reports it seems to make her sinus issues worse.  She is averaging 6 to 7 hours of sleep per night.  She does feel rested when she wakes up.  There is no sleep study on file.  HTN: Her BP today is 138/90, however she has not taken her blood pressure medication today.  She is taking Amlodipine as prescribed.  ECG from 12/2016 reviewed.  Headaches: She reports these are better, ever since she cut out meat.  They occur 1-2 times a month.  She takes Fioricet as needed with good relief.  MRI brain from 01/2014 reviewed.  She is not following with neurology  Depression: She reports labile mood at times.  She weaned herself off of her Sertraline and is not interested in restarting at this time.  She denies issues with anxiety, SI/HI.  DM 2: Her last A1c was 7.3%, 08/2016.  She is taking metformin as prescribed.  She does not monitor her sugars but denies signs and symptoms of hypoglycemia.  She has not had an eye exam in the last year.  She checks her feet daily.  Flu: 03/2018 Tetanus: Unsure Pneumovax: never Pap Smear: 12/2010, scheduled 1/17 Mammogram: 06/2018 Vision Screening: As needed Dentist: Biannually  Diet: She does eat meat.  She consumes fruits and vegetables daily.  She does eat some fried foods.  She drinks mostly water and sweet tea. Exercise: None  Review of Systems  Past Medical History:  Diagnosis Date  . Allergy   . Chicken pox   . Depression   . Frequent headaches    otc med prn  . History of blood transfusion 07/2009   [place] - 2 units transfused  . Hypertension   . Sleep apnea    uses cpap nightly      Current Outpatient Medications  Medication Sig Dispense Refill  . amLODipine (NORVASC) 10 MG tablet TAKE 1 TABLET (10 MG TOTAL) BY MOUTH DAILY. 30 tablet 0  . Butalbital-APAP-Caffeine 50-300-40 MG CAPS Take 1 tablet by mouth daily as needed. 30 capsule 0  . etonogestrel (NEXPLANON) 68 MG IMPL implant 1 each by Subdermal route once. Inserted 10/08/2014    . fluticasone (FLONASE) 50 MCG/ACT nasal spray Place 1 spray into both nostrils daily as needed for allergies.   0  . metFORMIN (GLUCOPHAGE-XR) 500 MG 24 hr tablet Take 1 tablet by mouth daily after supper.    Marland Kitchen oxymetazoline (AFRIN) 0.05 % nasal spray Place 2 sprays into both nostrils 2 (two) times daily as needed for congestion.    . sertraline (ZOLOFT) 100 MG tablet Take 1 tablet (100 mg total) by mouth daily. 30 tablet 3   No current facility-administered medications for this visit.     Allergies  Allergen Reactions  . Latex Hives  . Prednisone Swelling, Rash and Other (See Comments)    Elevated blood pressure    Family History  Problem Relation Age of Onset  . Hypertension Mother   . Arthritis Maternal Aunt   . Arthritis Maternal Grandmother   . Cancer Maternal Grandmother  Breast  . Hypertension Maternal Grandmother     Social History   Socioeconomic History  . Marital status: Married    Spouse name: Not on file  . Number of children: Not on file  . Years of education: Not on file  . Highest education level: Not on file  Occupational History  . 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 Financial resource strain: Not on file  . Food insecurity:    Worry: Not on file    Inability: Not on file  . Transportation needs:    Medical: Not on file    Non-medical: Not on file  Tobacco Use  . Smoking status: Never Smoker  . Smokeless tobacco: Never Used  Substance and Sexual Activity  . Alcohol use: Yes    Alcohol/week: 1.0 standard drinks    Types: 1 Glasses of wine per week     Comment: occasional wine  . Drug use: No  . Sexual activity: Yes    Birth control/protection: Implant    Comment: nexplanon  Lifestyle  . Physical activity:    Days per week: Not on file    Minutes per session: Not on file  . Stress: Not on file  Relationships  . Social connections:    Talks on phone: Not on file    Gets together: Not on file    Attends religious service: Not on file    Active member of club or organization: Not on file    Attends meetings of clubs or organizations: Not on file    Relationship status: Not on file  . Intimate partner violence:    Fear of current or ex partner: Not on file    Emotionally abused: Not on file    Physically abused: Not on file    Forced sexual activity: Not on file  Other Topics Concern  . Not on file  Social History Narrative  . Not on file     Constitutional: Denies fever, malaise, fatigue, headache or abrupt weight changes.  HEENT: Denies eye pain, eye redness, ear pain, ringing in the ears, wax buildup, runny nose, nasal congestion, bloody nose, or sore throat. Respiratory: Denies difficulty breathing, shortness of breath, cough or sputum production.   Cardiovascular: Denies chest pain, chest tightness, palpitations or swelling in the hands or feet.  Gastrointestinal: Denies abdominal pain, bloating, constipation, diarrhea or blood in the stool.  GU: Denies urgency, frequency, pain with urination, burning sensation, blood in urine, odor or discharge. Musculoskeletal: Denies decrease in range of motion, difficulty with gait, muscle pain or joint pain and swelling.  Skin: Denies redness, rashes, lesions or ulcercations.  Neurological: Denies dizziness, difficulty with memory, difficulty with speech or problems with balance and coordination.  Psych: Patient has a history of depression.  Denies anxiety, SI/HI.  No other specific complaints in a complete review of systems (except as listed in HPI above).     Objective:    Physical Exam  BP 138/90   Pulse 80   Temp 98.3 F (36.8 C) (Oral)   Ht 5' 3.25" (1.607 m)   Wt 201 lb (91.2 kg)   SpO2 98%   BMI 35.32 kg/m  Wt Readings from Last 3 Encounters:  08/01/18 201 lb (91.2 kg)  12/29/16 206 lb (93.4 kg)  10/29/16 206 lb 8 oz (93.7 kg)    General: Appears her stated age, obese, in NAD. Skin: Warm, dry and intact. No ulcerations noted. HEENT: Head: normal shape and size;  Eyes: sclera white, no icterus, conjunctiva pink, PERRLA and EOMs intact; Ears: Tm's gray and intact, normal light reflex; Throat/Mouth: Teeth present, mucosa pink and moist, no exudate, lesions or ulcerations noted.  Neck:  Neck supple, trachea midline. No masses, lumps or thyromegaly present.  Cardiovascular: Normal rate and rhythm. S1,S2 noted.  No murmur, rubs or gallops noted. No JVD or BLE edema.  Pulmonary/Chest: Normal effort and positive vesicular breath sounds. No respiratory distress. No wheezes, rales or ronchi noted.  Abdomen: Soft and nontender. Normal bowel sounds. No distention or masses noted. Liver, spleen and kidneys non palpable. Musculoskeletal: Strength 5/5 BUE/BLE.Marland Kitchen No difficulty with gait.  Neurological: Alert and oriented. Cranial nerves II-XII grossly intact. Coordination normal.  Psychiatric: Mood and affect normal. Behavior is normal. Judgment and thought content normal.     BMET    Component Value Date/Time   NA 137 08/01/2018 1144   K 3.9 08/01/2018 1144   CL 104 08/01/2018 1144   CO2 26 08/01/2018 1144   GLUCOSE 94 08/01/2018 1144   BUN 6 08/01/2018 1144   CREATININE 0.74 08/01/2018 1144   CREATININE 0.93 01/17/2015 1524   CALCIUM 8.8 08/01/2018 1144   GFRNONAA >60 12/29/2016 2123   GFRNONAA >60 02/14/2014 1205   GFRAA >60 12/29/2016 2123   GFRAA >60 02/14/2014 1205    Lipid Panel     Component Value Date/Time   CHOL 181 08/01/2018 1144   TRIG 59.0 08/01/2018 1144   HDL 54.30 08/01/2018 1144   CHOLHDL 3 08/01/2018 1144   VLDL 11.8  08/01/2018 1144   LDLCALC 115 (H) 08/01/2018 1144    CBC    Component Value Date/Time   WBC 9.4 08/01/2018 1144   RBC 5.05 08/01/2018 1144   HGB 14.0 08/01/2018 1144   HCT 41.0 08/01/2018 1144   PLT 422.0 (H) 08/01/2018 1144   MCV 81.0 08/01/2018 1144   MCH 27.3 12/29/2016 2123   MCHC 34.1 08/01/2018 1144   RDW 13.0 08/01/2018 1144   LYMPHSABS 3,815 10/29/2016 1639   MONOABS 545 10/29/2016 1639   EOSABS 109 10/29/2016 1639   BASOSABS 0 10/29/2016 1639    Hgb A1C Lab Results  Component Value Date   HGBA1C 7.1 (H) 08/01/2018           Assessment & Plan:  Preventative Health Maintenance:  Flu shot up-to-date Tdap today Pap smear scheduled Mammogram up-to-date Encouraged her to consume a balanced diet and exercise regimen Advised her to see an eye doctor and dentist annually Will check CBC, C met, lipid, A1c, microalbumin and vitamin D today  RTC in 6 months for follow-up of chronic conditions [facility]

## 2018-08-02 ENCOUNTER — Encounter: Payer: Self-pay | Admitting: Internal Medicine

## 2018-08-02 ENCOUNTER — Other Ambulatory Visit: Payer: Self-pay | Admitting: Internal Medicine

## 2018-08-02 DIAGNOSIS — Z23 Encounter for immunization: Secondary | ICD-10-CM

## 2018-08-02 DIAGNOSIS — E7800 Pure hypercholesterolemia, unspecified: Secondary | ICD-10-CM

## 2018-08-02 DIAGNOSIS — E559 Vitamin D deficiency, unspecified: Secondary | ICD-10-CM

## 2018-08-02 MED ORDER — VITAMIN D (ERGOCALCIFEROL) 1.25 MG (50000 UNIT) PO CAPS: 50000.0000 [IU] | ORAL_CAPSULE | ORAL | 0 refills | Status: DC

## 2018-08-02 NOTE — Assessment & Plan Note (Signed)
A1c and urine microalbumin today Encouraged her to consume a low carb diet and exercise for weight loss Continue Metformin for now Referral to ophthalmology for diabetic eye exam Foot exam today She declines Pneumovax today

## 2018-08-02 NOTE — Assessment & Plan Note (Signed)
Improved with diet changes Continue Fioricet as needed  we will monitor

## 2018-08-02 NOTE — Assessment & Plan Note (Signed)
Elevated today but typically controlled on Amlodipine If remains elevated, consider low-dose ACEI or ARB Reinforced DASH diet and exercise for weight loss C met today

## 2018-08-02 NOTE — Patient Instructions (Signed)

## 2018-08-02 NOTE — Assessment & Plan Note (Signed)
Encouraged her to continue to use her CPAP every night Discussed how weight loss could help improve her sleep apnea We will monitor

## 2018-08-02 NOTE — Assessment & Plan Note (Signed)
Chronic but currently stable off meds If she is interested in restarting Sertraline, she will let me know Support offered today

## 2018-08-02 NOTE — Addendum Note (Signed)
Addended by: Lurlean Nanny on: 08/02/2018 05:15 PM   Modules accepted: Orders

## 2018-08-02 NOTE — Assessment & Plan Note (Signed)
Currently not an issue No prescribed inhalers We will monitor

## 2018-08-24 MED ORDER — ATORVASTATIN CALCIUM 10 MG PO TABS: 5.0000 mg | ORAL_TABLET | Freq: Every day | ORAL | 0 refills | Status: DC

## 2018-08-24 NOTE — Addendum Note (Signed)
Addended by: Lurlean Nanny on: 08/24/2018 05:02 PM   Modules accepted: Orders

## 2018-10-24 ENCOUNTER — Other Ambulatory Visit: Payer: Self-pay

## 2018-10-24 ENCOUNTER — Encounter: Payer: Self-pay | Admitting: Internal Medicine

## 2018-10-24 ENCOUNTER — Ambulatory Visit (INDEPENDENT_AMBULATORY_CARE_PROVIDER_SITE_OTHER): Payer: 59 | Admitting: Internal Medicine

## 2018-10-24 DIAGNOSIS — F419 Anxiety disorder, unspecified: Secondary | ICD-10-CM | POA: Diagnosis not present

## 2018-10-24 DIAGNOSIS — F32A Depression, unspecified: Secondary | ICD-10-CM

## 2018-10-24 DIAGNOSIS — F329 Major depressive disorder, single episode, unspecified: Secondary | ICD-10-CM

## 2018-10-24 MED ORDER — SERTRALINE HCL 25 MG PO TABS: 25.0000 mg | ORAL_TABLET | Freq: Every day | ORAL | 2 refills | Status: DC

## 2018-10-24 MED ORDER — HYDROXYZINE HCL 10 MG PO TABS: 10.0000 mg | ORAL_TABLET | Freq: Two times a day (BID) | ORAL | 0 refills | Status: DC | PRN

## 2018-10-24 NOTE — Progress Notes (Signed)
Virtual Visit via Video Note  I connected with Martha Randall on 10/24/18 at  8:00 AM EDT by a video enabled telemedicine application and verified that I am speaking with the correct person using two identifiers.   I discussed the limitations of evaluation and management by telemedicine and the availability of in person appointments. The patient expressed understanding and agreed to proceed.  History of Present Illness:  Pt reports anxiety. She reports this has started in the last few weeks. She reports she feels a heaviness in her chest, but denies chest pain, shortness of breath, dizziness or diaphoresis. She has also noticed muscle tension in her shoulders. 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 She reports her sister in currently in the ICU with an abscess but they are not allowed to visit her. She has a history of depression and reports this feels different. She has not taken anything for anxiety or depression in the past. She has been using breathing techniques and distraction which helps but she feels like she needs additional help at this time. She denies SI/HI.   Observations/Objective:  Alert and oriented x 3 NAD Well kempt, behavior, judgement and thought content are normal  Assessment and Plan:  Anxiety and Depression:  Anxiety has deteriorated, depression stable Support offered today Offered referral for psychotherapy- she declines at this time Discussed treatment with SSRI therapy, benefits vs risk, side effects Will trial Sertraline 25 mg, eRx sent to pharmacy RX for Hydroxyzine 10 mg BID prn for breakthrough anxiety  Follow Up Instructions:    I discussed the assessment and treatment plan with the patient. The patient was provided an opportunity to ask questions and all were answered. The patient agreed with the plan and demonstrated an understanding of the instructions.   The patient was  advised to call back or seek an in-person evaluation if the symptoms worsen or if the condition fails to improve as anticipated.     Webb Silversmith, NP

## 2018-10-24 NOTE — Patient Instructions (Signed)

## 2018-10-31 ENCOUNTER — Other Ambulatory Visit: Payer: 59

## 2018-10-31 ENCOUNTER — Other Ambulatory Visit: Payer: Self-pay

## 2018-10-31 ENCOUNTER — Other Ambulatory Visit (INDEPENDENT_AMBULATORY_CARE_PROVIDER_SITE_OTHER): Payer: 59

## 2018-10-31 DIAGNOSIS — E7800 Pure hypercholesterolemia, unspecified: Secondary | ICD-10-CM

## 2018-10-31 DIAGNOSIS — E559 Vitamin D deficiency, unspecified: Secondary | ICD-10-CM

## 2018-10-31 LAB — LIPID PANEL
Cholesterol: 127 mg/dL (ref 0–200)
HDL: 48.4 mg/dL (ref >39.00)
LDL Cholesterol: 68 mg/dL (ref 0–99)
NonHDL: 78.84
Total CHOL/HDL Ratio: 3
Triglycerides: 56 mg/dL (ref 0.0–149.0)
VLDL: 11.2 mg/dL (ref 0.0–40.0)

## 2018-10-31 LAB — COMPREHENSIVE METABOLIC PANEL
ALT: 13 U/L (ref 0–35)
AST: 12 U/L (ref 0–37)
Albumin: 3.6 g/dL (ref 3.5–5.2)
Alkaline Phosphatase: 82 U/L (ref 39–117)
BUN: 10 mg/dL (ref 6–23)
CO2: 28 mEq/L (ref 19–32)
Calcium: 8.8 mg/dL (ref 8.4–10.5)
Chloride: 104 mEq/L (ref 96–112)
Creatinine, Ser: 0.83 mg/dL (ref 0.40–1.20)
GFR: 90.38 mL/min (ref >60.00)
Glucose, Bld: 122 mg/dL — ABNORMAL HIGH (ref 70–99)
Potassium: 3.3 mEq/L — ABNORMAL LOW (ref 3.5–5.1)
Sodium: 140 mEq/L (ref 135–145)
Total Bilirubin: 1 mg/dL (ref 0.2–1.2)
Total Protein: 6.8 g/dL (ref 6.0–8.3)

## 2018-10-31 LAB — VITAMIN D 25 HYDROXY (VIT D DEFICIENCY, FRACTURES): VITD: 40.33 ng/mL (ref 30.00–100.00)

## 2018-11-15 IMAGING — CR DG CHEST 2V
2 series · 2 of 2 positions shown · non-contrast
Comparison: 08/06/2016

CLINICAL DATA: Left arm pain radiating to the neck and back

EXAM:
CHEST  2 VIEW

[chest pa]
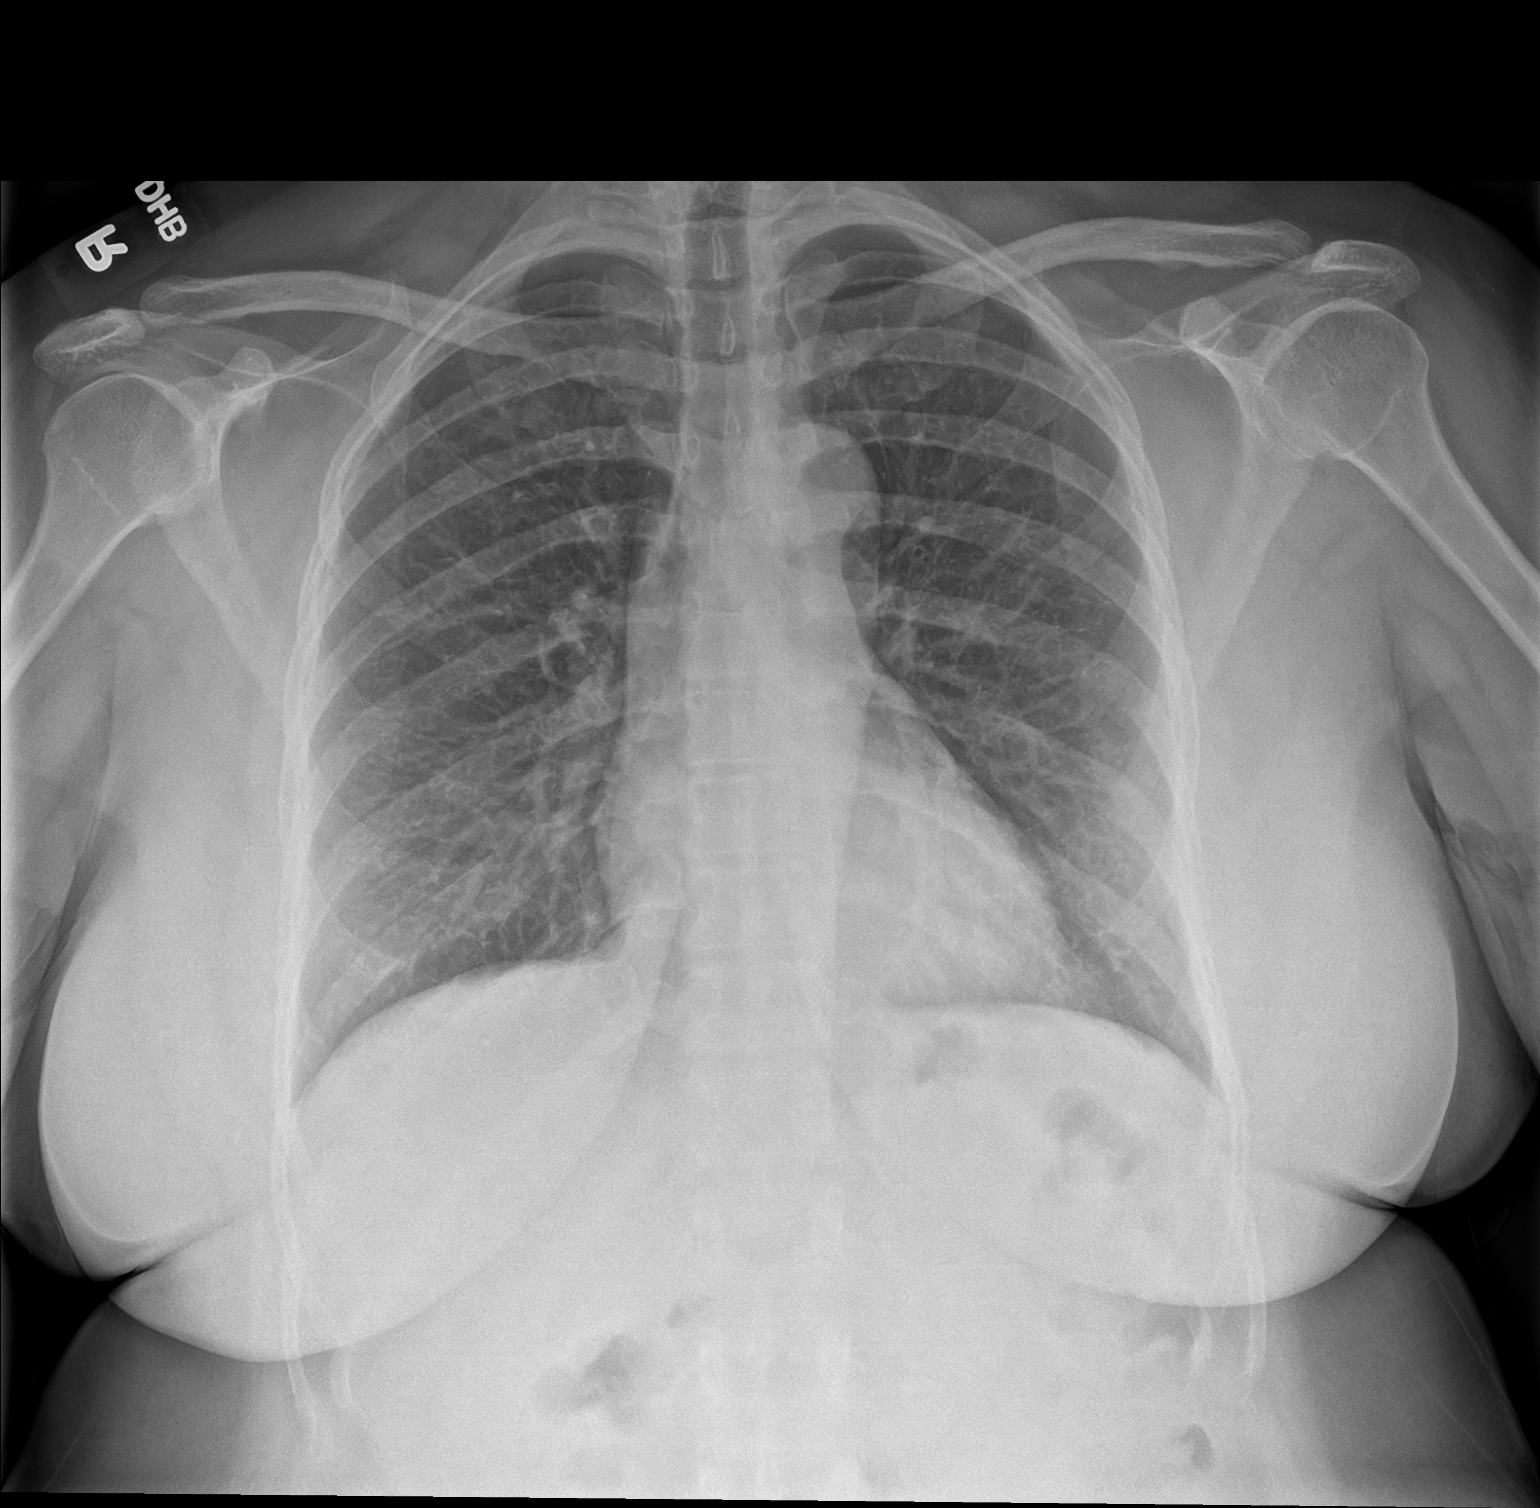

[chest lat]
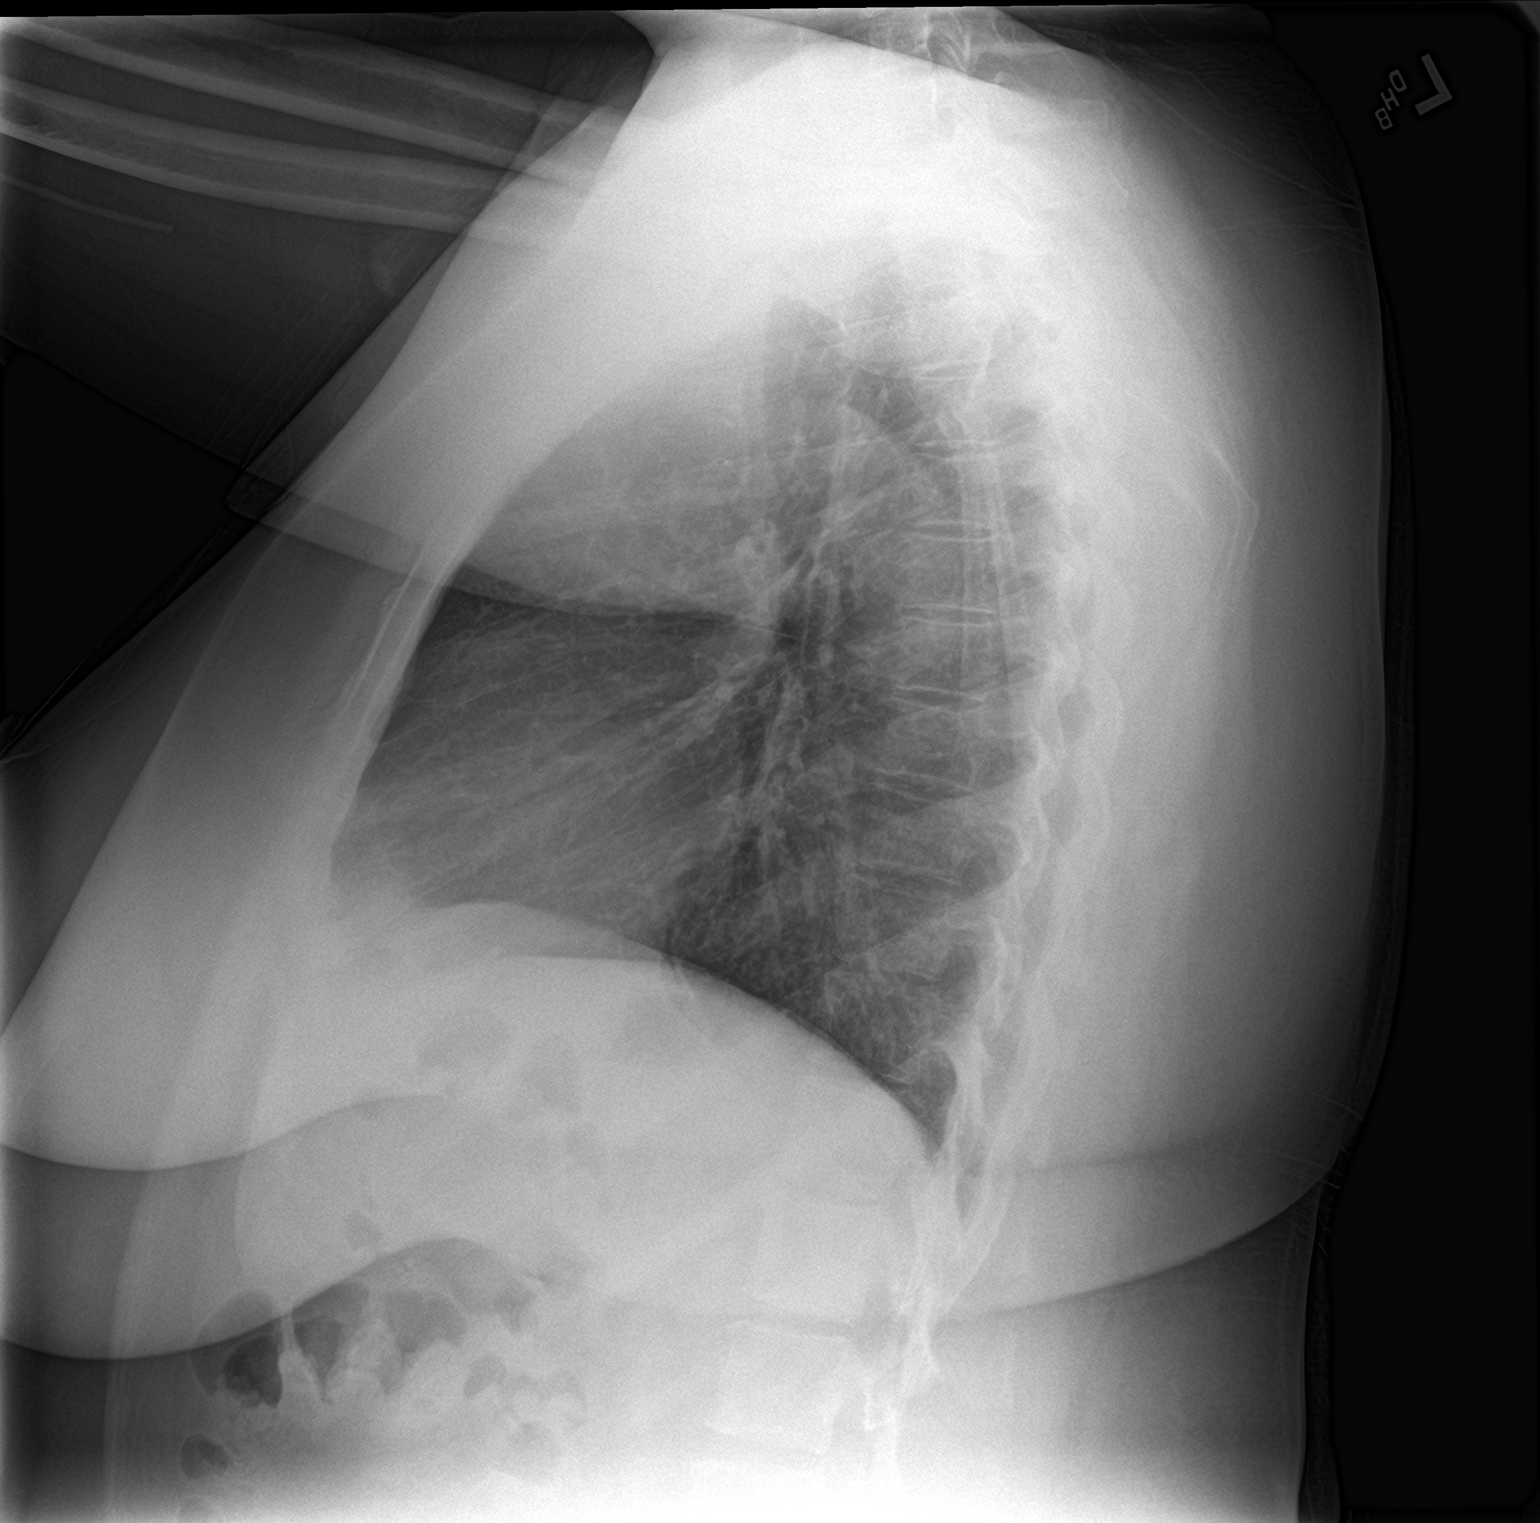

[2 of 2 positions shown; findings below may reference images not displayed]

FINDINGS: The heart size and mediastinal contours are within normal limits.
Mildly coarse interstitial opacity, not significantly changed. Both
lungs are clear. The visualized skeletal structures are
unremarkable.
IMPRESSION: No active cardiopulmonary disease.

## 2018-11-16 ENCOUNTER — Other Ambulatory Visit: Payer: Self-pay | Admitting: Internal Medicine

## 2018-11-23 ENCOUNTER — Ambulatory Visit (INDEPENDENT_AMBULATORY_CARE_PROVIDER_SITE_OTHER): Payer: 59 | Admitting: Internal Medicine

## 2018-11-23 ENCOUNTER — Encounter: Payer: Self-pay | Admitting: Internal Medicine

## 2018-11-23 DIAGNOSIS — F329 Major depressive disorder, single episode, unspecified: Secondary | ICD-10-CM

## 2018-11-23 DIAGNOSIS — F419 Anxiety disorder, unspecified: Secondary | ICD-10-CM | POA: Diagnosis not present

## 2018-11-23 DIAGNOSIS — F32A Depression, unspecified: Secondary | ICD-10-CM

## 2018-11-23 NOTE — Progress Notes (Signed)
Virtual Visit via Video Note  I connected with Martha Randall on 11/23/18 at  4:00 PM EDT by a video enabled telemedicine application and verified that I am speaking with the correct person using two identifiers.  Location: Patient: Home Provider: Office   I discussed the limitations of evaluation and management by telemedicine and the availability of in person appointments. The patient expressed understanding and agreed to proceed.  History of Present Illness:  Pt due for 1 month follow up anxiety and depression. At her last visit, she was started on Sertraline and Hydroxyzine. She has been taking the medications as prescribed. She denies adverse side effects. She reports significant improvement in her mood. She is not currently seeing a therapist. She denies SI/HI.    Past Medical History:  Diagnosis Date  . Allergy   . Chicken pox   . Depression   . Frequent headaches    otc med prn  . History of blood transfusion 07/2009   [place] - 2 units transfused  . Hypertension   . Sleep apnea    uses cpap nightly    Current Outpatient Medications  Medication Sig Dispense Refill  . amLODipine (NORVASC) 10 MG tablet TAKE 1 TABLET BY MOUTH EVERY DAY 30 tablet 5  . atorvastatin (LIPITOR) 10 MG tablet Take 0.5 tablets (5 mg total) by mouth daily. 45 tablet 0  . Butalbital-APAP-Caffeine 50-300-40 MG CAPS Take 1 tablet by mouth daily as needed. 30 capsule 0  . etonogestrel (NEXPLANON) 68 MG IMPL implant 1 each by Subdermal route once. Inserted 10/08/2014    . fluticasone (FLONASE) 50 MCG/ACT nasal spray Place 1 spray into both nostrils daily as needed for allergies.   0  . hydrOXYzine (ATARAX/VISTARIL) 10 MG tablet Take 1 tablet (10 mg total) by mouth every 12 (twelve) hours as needed. 60 tablet 0  . metFORMIN (GLUCOPHAGE-XR) 500 MG 24 hr tablet Take 1 tablet by mouth daily after supper.    Marland Kitchen oxymetazoline (AFRIN) 0.05 % nasal spray Place 2 sprays into both nostrils 2 (two) times daily  as needed for congestion.    . sertraline (ZOLOFT) 25 MG tablet Take 1 tablet (25 mg total) by mouth daily. 30 tablet 2  . Vitamin D, Ergocalciferol, (DRISDOL) 1.25 MG (50000 UT) CAPS capsule Take 1 capsule (50,000 Units total) by mouth every 7 (seven) days. 12 capsule 0   No current facility-administered medications for this visit.     Allergies  Allergen Reactions  . Latex Hives  . Prednisone Swelling, Rash and Other (See Comments)    Elevated blood pressure    Family History  Problem Relation Age of Onset  . Hypertension Mother   . Arthritis Maternal Aunt   . Arthritis Maternal Grandmother   . Cancer Maternal Grandmother        Breast  . Hypertension Maternal Grandmother     Social History   Socioeconomic History  . Marital status: Married    Spouse name: Not on file  . Number of children: Not on file  . Years of education: Not on file  . Highest education level: Not on file  Occupational History  . 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 Financial resource strain: Not on file  . Food insecurity:    Worry: Not on file    Inability: Not on file  . Transportation needs:    Medical: Not on file    Non-medical: Not on file  Tobacco Use  . Smoking  status: Never Smoker  . Smokeless tobacco: Never Used  Substance and Sexual Activity  . Alcohol use: Yes    Alcohol/week: 1.0 standard drinks    Types: 1 Glasses of wine per week    Comment: occasional wine  . Drug use: No  . Sexual activity: Yes    Birth control/protection: Implant    Comment: nexplanon  Lifestyle  . Physical activity:    Days per week: Not on file    Minutes per session: Not on file  . Stress: Not on file  Relationships  . Social connections:    Talks on phone: Not on file    Gets together: Not on file    Attends religious service: Not on file    Active member of club or organization: Not on file    Attends meetings of clubs or organizations: Not on file     Relationship status: Not on file  . Intimate partner violence:    Fear of current or ex partner: Not on file    Emotionally abused: Not on file    Physically abused: Not on file    Forced sexual activity: Not on file  Other Topics Concern  . Not on file  Social History Narrative  . Not on file     Constitutional: Denies fever, malaise, fatigue, headache or abrupt weight changes.  Respiratory: Denies difficulty breathing, shortness of breath, cough or sputum production.   Cardiovascular: Denies chest pain, chest tightness, palpitations or swelling in the hands or feet.   Psych: Pt reports anxiety and depression. Denies SI/HI.  No other specific complaints in a complete review of systems (except as listed in HPI above).  Wt Readings from Last 3 Encounters:  08/01/18 201 lb (91.2 kg)  12/29/16 206 lb (93.4 kg)  10/29/16 206 lb 8 oz (93.7 kg)    General: Appears her stated age, obese, in NAD. Pulmonary/Chest: Normal effort. No respiratory distress.  Neurological: Alert and oriented.  Psychiatric: Mood and affect normal. Behavior is normal. Judgment and thought content normal.     BMET    Component Value Date/Time   NA 140 10/31/2018 0831   K 3.3 (L) 10/31/2018 0831   CL 104 10/31/2018 0831   CO2 28 10/31/2018 0831   GLUCOSE 122 (H) 10/31/2018 0831   BUN 10 10/31/2018 0831   CREATININE 0.83 10/31/2018 0831   CREATININE 0.93 01/17/2015 1524   CALCIUM 8.8 10/31/2018 0831   GFRNONAA >60 12/29/2016 2123   GFRNONAA >60 02/14/2014 1205   GFRAA >60 12/29/2016 2123   GFRAA >60 02/14/2014 1205    Lipid Panel     Component Value Date/Time   CHOL 127 10/31/2018 0831   TRIG 56.0 10/31/2018 0831   HDL 48.40 10/31/2018 0831   CHOLHDL 3 10/31/2018 0831   VLDL 11.2 10/31/2018 0831   LDLCALC 68 10/31/2018 0831    CBC    Component Value Date/Time   WBC 9.4 08/01/2018 1144   RBC 5.05 08/01/2018 1144   HGB 14.0 08/01/2018 1144   HCT 41.0 08/01/2018 1144   PLT 422.0 (H)  08/01/2018 1144   MCV 81.0 08/01/2018 1144   MCH 27.3 12/29/2016 2123   MCHC 34.1 08/01/2018 1144   RDW 13.0 08/01/2018 1144   LYMPHSABS 3,815 10/29/2016 1639   MONOABS 545 10/29/2016 1639   EOSABS 109 10/29/2016 1639   BASOSABS 0 10/29/2016 1639    Hgb A1C Lab Results  Component Value Date   HGBA1C 7.1 (H) 08/01/2018  Assessment and Plan:  Anxiety and Depression:  Stable on current dose of Sertraline and Hydroxyzine Support offered today She does not think she needs a dose titration at this time Will monitor.  Follow Up Instructions:    I discussed the assessment and treatment plan with the patient. The patient was provided an opportunity to ask questions and all were answered. The patient agreed with the plan and demonstrated an understanding of the instructions.   The patient was advised to call back or seek an in-person evaluation if the symptoms worsen or if the condition fails to improve as anticipated.    Webb Silversmith, NP

## 2018-11-23 NOTE — Patient Instructions (Signed)

## 2018-11-24 ENCOUNTER — Ambulatory Visit: Payer: 59 | Admitting: Internal Medicine

## 2019-04-25 ENCOUNTER — Other Ambulatory Visit: Payer: Self-pay | Admitting: Internal Medicine

## 2019-04-25 DIAGNOSIS — Z20822 Contact with and (suspected) exposure to covid-19: Secondary | ICD-10-CM

## 2019-04-26 ENCOUNTER — Ambulatory Visit (INDEPENDENT_AMBULATORY_CARE_PROVIDER_SITE_OTHER): Payer: 59 | Admitting: Internal Medicine

## 2019-04-26 ENCOUNTER — Encounter: Payer: Self-pay | Admitting: Internal Medicine

## 2019-04-26 VITALS — BP 135/90 | Temp 96.9°F

## 2019-04-26 DIAGNOSIS — R432 Parageusia: Secondary | ICD-10-CM

## 2019-04-26 DIAGNOSIS — Z20828 Contact with and (suspected) exposure to other viral communicable diseases: Secondary | ICD-10-CM

## 2019-04-26 DIAGNOSIS — R6883 Chills (without fever): Secondary | ICD-10-CM

## 2019-04-26 NOTE — Progress Notes (Signed)
Virtual Visit via Video Note  I connected with Martha Randall on 04/26/19 at 11:15 AM EDT by a video enabled telemedicine application and verified that I am speaking with the correct person using two identifiers.  Location: Patient: Home Provider: Office   I discussed the limitations of evaluation and management by telemedicine and the availability of in person appointments. The patient expressed understanding and agreed to proceed.  History of Present Illness:  Pt reports abnormal taste in mouth. She noticed this when she baked cookies recently, its possible that this is related to change made to the recipe. Her primary concern is that when she measured her temperature recently she got a much lower than typical reading (97.9 versus a usual temp around 99). She describes feeling cold. She had a mildly sore and dry throat upon waking the last two days, but the discomfort went away after drinking hot coffee. She denies sick contacts and underwent COVID19 testing yesterday; she is currently self-isolating.   Past Medical History:  Diagnosis Date  . Allergy   . Chicken pox   . Depression   . Frequent headaches    otc med prn  . History of blood transfusion 07/2009   [place] - 2 units transfused  . Hypertension   . Sleep apnea    uses cpap nightly    Current Outpatient Medications  Medication Sig Dispense Refill  . amLODipine (NORVASC) 10 MG tablet TAKE 1 TABLET BY MOUTH EVERY DAY 30 tablet 5  . atorvastatin (LIPITOR) 10 MG tablet Take 0.5 tablets (5 mg total) by mouth daily. 45 tablet 0  . Butalbital-APAP-Caffeine 50-300-40 MG CAPS Take 1 tablet by mouth daily as needed. 30 capsule 0  . etonogestrel (NEXPLANON) 68 MG IMPL implant 1 each by Subdermal route once. Inserted 10/08/2014    . fluticasone (FLONASE) 50 MCG/ACT nasal spray Place 1 spray into both nostrils daily as needed for allergies.   0  . hydrOXYzine (ATARAX/VISTARIL) 10 MG tablet TAKE 1 TABLET (10 MG TOTAL) BY MOUTH  EVERY 12 (TWELVE) HOURS AS NEEDED. 180 tablet 0  . metFORMIN (GLUCOPHAGE-XR) 500 MG 24 hr tablet Take 1 tablet by mouth daily after supper.    Marland Kitchen oxymetazoline (AFRIN) 0.05 % nasal spray Place 2 sprays into both nostrils 2 (two) times daily as needed for congestion.    . sertraline (ZOLOFT) 25 MG tablet Take 1 tablet (25 mg total) by mouth daily. 30 tablet 2  . Vitamin D, Ergocalciferol, (DRISDOL) 1.25 MG (50000 UT) CAPS capsule Take 1 capsule (50,000 Units total) by mouth every 7 (seven) days. 12 capsule 0   No current facility-administered medications for this visit.     Allergies  Allergen Reactions  . Latex Hives  . Prednisone Swelling, Rash and Other (See Comments)    Elevated blood pressure    Family History  Problem Relation Age of Onset  . Hypertension Mother   . Arthritis Maternal Aunt   . Arthritis Maternal Grandmother   . Cancer Maternal Grandmother        Breast  . Hypertension Maternal Grandmother     Social History   Socioeconomic History  . Marital status: Married    Spouse name: Not on file  . Number of children: Not on file  . Years of education: Not on file  . Highest education level: Not on file  Occupational History  . 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 Financial resource strain: Not on file  . Food  insecurity    Worry: Not on file    Inability: Not on file  . Transportation needs    Medical: Not on file    Non-medical: Not on file  Tobacco Use  . Smoking status: Never Smoker  . Smokeless tobacco: Never Used  Substance and Sexual Activity  . Alcohol use: Yes    Alcohol/week: 1.0 standard drinks    Types: 1 Glasses of wine per week    Comment: occasional wine  . Drug use: No  . Sexual activity: Yes    Birth control/protection: Implant    Comment: nexplanon  Lifestyle  . Physical activity    Days per week: Not on file    Minutes per session: Not on file  . Stress: Not on file  Relationships  . Social  Herbalist on phone: Not on file    Gets together: Not on file    Attends religious service: Not on file    Active member of club or organization: Not on file    Attends meetings of clubs or organizations: Not on file    Relationship status: Not on file  . Intimate partner violence    Fear of current or ex partner: Not on file    Emotionally abused: Not on file    Physically abused: Not on file    Forced sexual activity: Not on file  Other Topics Concern  . Not on file  Social History Narrative  . Not on file     Constitutional: Denies fever, malaise, fatigue, headache or abrupt weight changes.  HEENT: Pt reports abnormal taste in mouth. Denies eye pain, eye redness, ear pain, ringing in the ears, wax buildup, runny nose, nasal congestion, bloody nose, or sore throat. Respiratory: Denies difficulty breathing, shortness of breath, cough or sputum production.   Cardiovascular: Denies chest pain, chest tightness, palpitations or swelling in the hands or feet.  Skin: Denies redness, rashes, lesions or ulcercations.    No other specific complaints in a complete review of systems (except as listed in HPI above).    Observations/Objective:  Wt Readings from Last 3 Encounters:  08/01/18 201 lb (91.2 kg)  12/29/16 206 lb (93.4 kg)  10/29/16 206 lb 8 oz (93.7 kg)    General: Appears her stated age, obese, in NAD. HEENT: Throat/Mouth: No audible hoarseness.  Pulmonary/Chest: Normal effort. No respiratory distress.   Neurological: Alert and oriented.    BMET    Component Value Date/Time   NA 140 10/31/2018 0831   K 3.3 (L) 10/31/2018 0831   CL 104 10/31/2018 0831   CO2 28 10/31/2018 0831   GLUCOSE 122 (H) 10/31/2018 0831   BUN 10 10/31/2018 0831   CREATININE 0.83 10/31/2018 0831   CREATININE 0.93 01/17/2015 1524   CALCIUM 8.8 10/31/2018 0831   GFRNONAA >60 12/29/2016 2123   GFRNONAA >60 02/14/2014 1205   GFRAA >60 12/29/2016 2123   GFRAA >60 02/14/2014 1205     Lipid Panel     Component Value Date/Time   CHOL 127 10/31/2018 0831   TRIG 56.0 10/31/2018 0831   HDL 48.40 10/31/2018 0831   CHOLHDL 3 10/31/2018 0831   VLDL 11.2 10/31/2018 0831   LDLCALC 68 10/31/2018 0831    CBC    Component Value Date/Time   WBC 9.4 08/01/2018 1144   RBC 5.05 08/01/2018 1144   HGB 14.0 08/01/2018 1144   HCT 41.0 08/01/2018 1144   PLT 422.0 (H) 08/01/2018 1144   MCV 81.0 08/01/2018  [place] 27.3 12/29/2016 2123   MCHC 34.1 08/01/2018 1144   RDW 13.0 08/01/2018 1144   LYMPHSABS 3,815 10/29/2016 1639   MONOABS 545 10/29/2016 1639   EOSABS 109 10/29/2016 1639   BASOSABS 0 10/29/2016 1639    Hgb A1C Lab Results  Component Value Date   HGBA1C 7.1 (H) 08/01/2018        Assessment and Plan:  Chills, Dysgeusia:  Reassurance given.  Patient will continue daily temperature checks and self isolate while she waits for her COVID test results.  She was advised to take her allergy medications to help with symptom relief and to stay hydrated.  Return precautions discussed  Follow Up Instructions:    I discussed the assessment and treatment plan with the patient. The patient was provided an opportunity to ask questions and all were answered. The patient agreed with the plan and demonstrated an understanding of the instructions.   The patient was advised to call back or seek an in-person evaluation if the symptoms worsen or if the condition fails to improve as anticipated.    Webb Silversmith, NP

## 2019-04-26 NOTE — Patient Instructions (Signed)
COVID-19: How to Protect Yourself and Others Know how it spreads  There is currently no vaccine to prevent coronavirus disease 2019 (COVID-19).  The best way to prevent illness is to avoid being exposed to this virus.  The virus is thought to spread mainly from person-to-person. ? Between people who are in close contact with one another (within about 6 feet). ? Through respiratory droplets produced when an infected person coughs, sneezes or talks. ? These droplets can land in the mouths or noses of people who are nearby or possibly be inhaled into the lungs. ? Some recent studies have suggested that COVID-19 may be spread by people who are not showing symptoms. Everyone should Clean your hands often  Wash your hands often with soap and water for at least 20 seconds especially after you have been in a public place, or after blowing your nose, coughing, or sneezing.  If soap and water are not readily available, use a hand sanitizer that contains at least 60% alcohol. Cover all surfaces of your hands and rub them together until they feel dry.  Avoid touching your eyes, nose, and mouth with unwashed hands. Avoid close contact  Stay home if you are sick.  Avoid close contact with people who are sick.  Put distance between yourself and other people. ? Remember that some people without symptoms may be able to spread virus. ? This is especially important for people who are at higher risk of getting very sick.www.cdc.gov/coronavirus/2019-ncov/need-extra-precautions/people-at-higher-risk.html Cover your mouth and nose with a cloth face cover when around others  You could spread COVID-19 to others even if you do not feel sick.  Everyone should wear a cloth face cover when they have to go out in public, for example to the grocery store or to pick up other necessities. ? Cloth face coverings should not be placed on young children under age 2, anyone who has trouble breathing, or is unconscious,  incapacitated or otherwise unable to remove the mask without assistance.  The cloth face cover is meant to protect other people in case you are infected.  Do NOT use a facemask meant for a healthcare worker.  Continue to keep about 6 feet between yourself and others. The cloth face cover is not a substitute for social distancing. Cover coughs and sneezes  If you are in a private setting and do not have on your cloth face covering, remember to always cover your mouth and nose with a tissue when you cough or sneeze or use the inside of your elbow.  Throw used tissues in the trash.  Immediately wash your hands with soap and water for at least 20 seconds. If soap and water are not readily available, clean your hands with a hand sanitizer that contains at least 60% alcohol. Clean and disinfect  Clean AND disinfect frequently touched surfaces daily. This includes tables, doorknobs, light switches, countertops, handles, desks, phones, keyboards, toilets, faucets, and sinks. www.cdc.gov/coronavirus/2019-ncov/prevent-getting-sick/disinfecting-your-home.html  If surfaces are dirty, clean them: Use detergent or soap and water prior to disinfection.  Then, use a household disinfectant. You can see a list of EPA-registered household disinfectants here. cdc.gov/coronavirus 11/21/2018 This information is not intended to replace advice given to you by your health care provider. Make sure you discuss any questions you have with your health care provider. Document Released: 10/31/2018 Document Revised: 11/29/2018 Document Reviewed: 10/31/2018 Elsevier Patient Education  2020 Elsevier Inc.  

## 2019-04-27 LAB — NOVEL CORONAVIRUS, NAA: SARS-CoV-2, NAA: NOT DETECTED

## 2019-06-11 LAB — HM MAMMOGRAPHY

## 2019-07-09 ENCOUNTER — Other Ambulatory Visit: Payer: Self-pay | Admitting: Cardiology

## 2019-07-09 DIAGNOSIS — Z20822 Contact with and (suspected) exposure to covid-19: Secondary | ICD-10-CM

## 2019-07-10 LAB — NOVEL CORONAVIRUS, NAA: SARS-CoV-2, NAA: NOT DETECTED

## 2019-07-17 ENCOUNTER — Ambulatory Visit (INDEPENDENT_AMBULATORY_CARE_PROVIDER_SITE_OTHER)
Admission: RE | Admit: 2019-07-17 | Discharge: 2019-07-17 | Disposition: A | Discharge status: Home / Self Care | Payer: 59 | Source: Ambulatory Visit

## 2019-07-17 DIAGNOSIS — J0110 Acute frontal sinusitis, unspecified: Secondary | ICD-10-CM

## 2019-07-17 MED ORDER — AMOXICILLIN 500 MG PO CAPS: 500.0000 mg | ORAL_CAPSULE | Freq: Three times a day (TID) | ORAL | 0 refills | Status: AC

## 2019-07-17 NOTE — ED Provider Notes (Signed)
Virtual Visit via Video Note:  Martha Randall  initiated request for Telemedicine visit with [facility] team. I connected with Martha Randall  on 07/17/2019 at 1:10 PM  for a synchronized telemedicine visit using a video enabled HIPPA compliant telemedicine application. I verified that I am speaking with Martha Randall  using two identifiers. Martha Balloon, Martha Randall  was physically located in a [facility] Urgent care site and Martha Randall was located at a different location.   The limitations of evaluation and management by telemedicine as well as the availability of in-person appointments were discussed. Patient was informed that she  may incur a bill ( including co-pay) for this virtual visit encounter. Martha Randall  expressed understanding and gave verbal consent to proceed with virtual visit.     History of Present Illness:Martha Randall  is a 42 y.o. female presents for evaluation of frontal sinus headache x 3 days.  She also reports congestion and rhinorrhea x 1 week.  She has been taking OTC Copywriter, advertising for symptoms.  Denies fever, chills, sore throat, rash, cough, shortness of breath, vomiting, diarrhea, or other symptoms.  Patient states she has history of sinusitis and has required antibiotics in the past; she states this feels like her previous episodes.     Allergies  Allergen Reactions  . Latex Hives  . Prednisone Swelling, Rash and Other (See Comments)    Elevated blood pressure     Past Medical History:  Diagnosis Date  . Allergy   . Chicken pox   . Depression   . Frequent headaches    otc med prn  . History of blood transfusion 07/2009   [place] - 2 units transfused  . Hypertension   . Sleep apnea    uses cpap nightly     Social History   Tobacco Use  . Smoking status: Never Smoker  . Smokeless tobacco: Never Used  Substance Use Topics  . Alcohol use: Yes    Alcohol/week: 1.0 standard  drinks    Types: 1 Glasses of wine per week    Comment: occasional wine  . Drug use: No        Observations/Objective: Physical Exam  VITALS: Patient denies fever. GENERAL: Alert, appears well and in no acute distress. HEENT: Atraumatic. NECK: Normal movements of the head and neck. CARDIOPULMONARY: No increased WOB. Speaking in clear sentences. I:E ratio WNL.  MS: Moves all visible extremities without noticeable abnormality. PSYCH: Pleasant and cooperative, well-groomed. Speech normal rate and rhythm. Affect is appropriate. Insight and judgement are appropriate. Attention is focused, linear, and appropriate.  NEURO: CN grossly intact. Oriented as arrived to appointment on time with no prompting. Moves both UE equally.  SKIN: No obvious lesions, wounds, erythema, or cyanosis noted on face or hands.   Assessment and Plan:    ICD-10-CM   1. Acute non-recurrent frontal sinusitis  J01.10        Follow Up Instructions: Treating with amoxicillin.  Suggested OTC Mucinex and Flonase.  Instructed patient to follow-up with her PCP or come here to be seen in person if her symptoms are not improving.  Patient agrees to plan of care.      I discussed the assessment and treatment plan with the patient. The patient was provided an opportunity to ask questions and all were answered. The patient agreed with the plan and demonstrated an understanding of the instructions.   The patient was advised to call back or  seek an in-person evaluation if the symptoms worsen or if the condition fails to improve as anticipated.      Martha Balloon, Martha Randall  07/17/2019 1:10 PM         Martha Balloon, Martha Randall 07/17/19 1311

## 2019-07-17 NOTE — Discharge Instructions (Addendum)
Take the amoxicillin as directed.  Additionally, you can take over-the-counter Mucinex and Flonase Nasal Spray.    Follow-up with your primary care provider or come here to be seen in person if your symptoms are not improving.

## 2019-08-04 ENCOUNTER — Other Ambulatory Visit: Payer: Self-pay

## 2019-08-04 DIAGNOSIS — Z20822 Contact with and (suspected) exposure to covid-19: Secondary | ICD-10-CM

## 2019-08-05 LAB — NOVEL CORONAVIRUS, NAA: SARS-CoV-2, NAA: NOT DETECTED

## 2019-08-12 ENCOUNTER — Other Ambulatory Visit: Payer: Self-pay

## 2019-08-12 DIAGNOSIS — Z20822 Contact with and (suspected) exposure to covid-19: Secondary | ICD-10-CM

## 2019-08-13 ENCOUNTER — Encounter: Payer: Self-pay | Admitting: Internal Medicine

## 2019-08-13 LAB — NOVEL CORONAVIRUS, NAA: SARS-CoV-2, NAA: DETECTED — AB

## 2019-08-13 LAB — SPECIMEN STATUS REPORT

## 2019-08-14 ENCOUNTER — Telehealth: Payer: Self-pay | Admitting: Nurse Practitioner

## 2019-08-14 NOTE — Telephone Encounter (Signed)
Called to Discuss with patient about Covid symptoms and the use of bamlanivimab, a monoclonal antibody infusion for those with mild to moderate Covid symptoms and at a high risk of hospitalization.     Pt states that symptoms started over 10 days ago.

## 2019-08-31 ENCOUNTER — Other Ambulatory Visit: Payer: Self-pay

## 2019-08-31 NOTE — Telephone Encounter (Signed)
She needs to schedule an appt for her CPE first.

## 2019-08-31 NOTE — Telephone Encounter (Signed)
Patient is needing a refill on her Amlodipine. This was last refilled on 08/03/2018 for #30 with 5 refills. She was last seen for CPE on 08/01/2018 and has no upcoming appts. Is this ok to refill?

## 2019-09-03 ENCOUNTER — Encounter: Payer: Self-pay | Admitting: Internal Medicine

## 2019-09-03 NOTE — Telephone Encounter (Signed)
msg sent to pt letting her know t hat her CPE is overdue and she will need to schedule to avoid further delay with refills

## 2019-09-05 MED ORDER — AMLODIPINE BESYLATE 10 MG PO TABS: 10.0000 mg | ORAL_TABLET | Freq: Every day | ORAL | 1 refills | Status: DC

## 2019-09-19 LAB — HM DIABETES EYE EXAM

## 2019-09-24 ENCOUNTER — Encounter: Payer: Self-pay | Admitting: Internal Medicine

## 2019-09-27 ENCOUNTER — Ambulatory Visit: Payer: 59 | Attending: Family

## 2019-09-27 DIAGNOSIS — Z23 Encounter for immunization: Secondary | ICD-10-CM

## 2019-09-27 NOTE — Progress Notes (Signed)
   Covid-19 Vaccination Clinic  Name:  Martha Randall    MRN: [id number] DOB: 1976/06/09  09/27/2019  [name] was observed post Covid-19 immunization for 15 minutes without incident. She was provided with Vaccine Information Sheet and instruction to access the V-Safe system.   [name] was instructed to call 911 with any severe reactions post vaccine: Marland Kitchen Difficulty breathing  . Swelling of face and throat  . A fast heartbeat  . A bad rash all over body  . Dizziness and weakness   Immunizations Administered    Name Date Dose VIS Date Route   Moderna COVID-19 Vaccine 09/27/2019  1:04 PM 0.5 mL 06/19/2019 Intramuscular   Manufacturer: Moderna   Lot: [id number]   [id number]

## 2019-10-06 ENCOUNTER — Other Ambulatory Visit: Payer: Self-pay

## 2019-10-06 ENCOUNTER — Emergency Department: Payer: 59

## 2019-10-06 ENCOUNTER — Emergency Department
Admission: EM | Admit: 2019-10-06 | Discharge: 2019-10-06 | Disposition: A | Discharge status: Home / Self Care | Payer: 59 | Attending: Emergency Medicine | Admitting: Emergency Medicine

## 2019-10-06 ENCOUNTER — Encounter: Payer: Self-pay | Admitting: Emergency Medicine

## 2019-10-06 DIAGNOSIS — Z7984 Long term (current) use of oral hypoglycemic drugs: Secondary | ICD-10-CM | POA: Insufficient documentation

## 2019-10-06 DIAGNOSIS — Z79899 Other long term (current) drug therapy: Secondary | ICD-10-CM | POA: Diagnosis not present

## 2019-10-06 DIAGNOSIS — R102 Pelvic and perineal pain: Secondary | ICD-10-CM

## 2019-10-06 DIAGNOSIS — J45909 Unspecified asthma, uncomplicated: Secondary | ICD-10-CM | POA: Insufficient documentation

## 2019-10-06 DIAGNOSIS — N83209 Unspecified ovarian cyst, unspecified side: Secondary | ICD-10-CM | POA: Insufficient documentation

## 2019-10-06 DIAGNOSIS — R1031 Right lower quadrant pain: Secondary | ICD-10-CM | POA: Diagnosis present

## 2019-10-06 DIAGNOSIS — E119 Type 2 diabetes mellitus without complications: Secondary | ICD-10-CM | POA: Diagnosis not present

## 2019-10-06 DIAGNOSIS — R1110 Vomiting, unspecified: Secondary | ICD-10-CM | POA: Diagnosis not present

## 2019-10-06 DIAGNOSIS — I10 Essential (primary) hypertension: Secondary | ICD-10-CM | POA: Insufficient documentation

## 2019-10-06 DIAGNOSIS — Z91040 Latex allergy status: Secondary | ICD-10-CM | POA: Diagnosis not present

## 2019-10-06 DIAGNOSIS — D259 Leiomyoma of uterus, unspecified: Secondary | ICD-10-CM | POA: Diagnosis not present

## 2019-10-06 HISTORY — DX: COVID-19: U07.1

## 2019-10-06 LAB — CBC
HCT: 43 % (ref 36.0–46.0)
Hemoglobin: 14.2 g/dL (ref 12.0–15.0)
MCH: 27.6 pg (ref 26.0–34.0)
MCHC: 33 g/dL (ref 30.0–36.0)
MCV: 83.5 fL (ref 80.0–100.0)
Platelets: 443 10*3/uL — ABNORMAL HIGH (ref 150–400)
RBC: 5.15 MIL/uL — ABNORMAL HIGH (ref 3.87–5.11)
RDW: 12.5 % (ref 11.5–15.5)
WBC: 8.8 10*3/uL (ref 4.0–10.5)
nRBC: 0 % (ref 0.0–0.2)

## 2019-10-06 LAB — URINALYSIS, COMPLETE (UACMP) WITH MICROSCOPIC
Bacteria, UA: NONE SEEN
Bilirubin Urine: NEGATIVE
Glucose, UA: 50 mg/dL — AB
Hgb urine dipstick: NEGATIVE
Ketones, ur: 5 mg/dL — AB
Leukocytes,Ua: NEGATIVE
Nitrite: NEGATIVE
Protein, ur: >=300 mg/dL — AB
Specific Gravity, Urine: 1.027 (ref 1.005–1.030)
pH: 5 (ref 5.0–8.0)

## 2019-10-06 LAB — BASIC METABOLIC PANEL
Anion gap: 9 (ref 5–15)
BUN: 10 mg/dL (ref 6–20)
CO2: 27 mmol/L (ref 22–32)
Calcium: 8.6 mg/dL — ABNORMAL LOW (ref 8.9–10.3)
Chloride: 104 mmol/L (ref 98–111)
Creatinine, Ser: 0.86 mg/dL (ref 0.44–1.00)
GFR calc Af Amer: >60 mL/min (ref >60)
GFR calc non Af Amer: >60 mL/min (ref >60)
Glucose, Bld: 166 mg/dL — ABNORMAL HIGH (ref 70–99)
Potassium: 3.3 mmol/L — ABNORMAL LOW (ref 3.5–5.1)
Sodium: 140 mmol/L (ref 135–145)

## 2019-10-06 LAB — POCT PREGNANCY, URINE: Preg Test, Ur: NEGATIVE

## 2019-10-06 MED ORDER — NAPROXEN 500 MG PO TABS: 500.0000 mg | ORAL_TABLET | Freq: Two times a day (BID) | ORAL | 0 refills | Status: DC

## 2019-10-06 MED ORDER — ONDANSETRON HCL 4 MG/2ML IJ SOLN
4.0000 mg | Freq: Once | INTRAMUSCULAR | Status: AC
  Administered 2019-10-06: 4 mg via INTRAVENOUS
  Filled 2019-10-06: qty 2

## 2019-10-06 MED ORDER — MORPHINE SULFATE (PF) 4 MG/ML IV SOLN
4.0000 mg | Freq: Once | INTRAVENOUS | Status: AC
  Administered 2019-10-06: 4 mg via INTRAVENOUS
  Filled 2019-10-06: qty 1

## 2019-10-06 MED ORDER — HYDROCODONE-ACETAMINOPHEN 5-325 MG PO TABS: 1.0000 | ORAL_TABLET | ORAL | 0 refills | Status: DC | PRN

## 2019-10-06 NOTE — Discharge Instructions (Signed)
Please return to the emergency department for increase in pain or other symptoms of concern if unable to see gynecologist.

## 2019-10-06 NOTE — ED Provider Notes (Signed)
[facility] Emergency Department Provider Note ____________________________________________   First MD Initiated Contact with Patient 10/06/19 1319     (approximate)  I have reviewed the triage vital signs and the nursing notes.   HISTORY  Chief Complaint Flank Pain  HPI Martha Randall is a 44 y.o. female with a history of diabetes and hypertension that presents to the emergency department for treatment and evaluation of RLQ and right flank pain that started yesterday.  She had one episode of vomiting today as well.  She denies fevers.  She has not eaten anything today and has not taken any of her medications.     Past Medical History:  Diagnosis Date  . Allergy   . Chicken pox   . COVID-19   . Depression   . Frequent headaches    otc med prn  . History of blood transfusion 07/2009   [place] - 2 units transfused  . Hypertension   . Sleep apnea    uses cpap nightly    Patient Active Problem List   Diagnosis Date Noted  . DM (diabetes mellitus), type 2 ([place]) 10/04/2016  . OSA (obstructive sleep apnea) 01/17/2015  . HTN (hypertension) 01/28/2014  . Anxiety and depression 12/17/2013  . Frequent headaches 07/22/2010  . Allergic rhinitis 07/22/2010  . Asthma 07/22/2010    Past Surgical History:  Procedure Laterality Date  . BREAST BIOPSY Right 2004   lump is tagged  . CESAREAN SECTION     x 1  . HYSTEROSCOPY N/A 10/08/2014   Procedure: HYSTEROSCOPIC IUD Removal/Nexplanon Insertion;  Surgeon: Servando [place], MD;  Location: [facility];  Service: Gynecology;  Laterality: N/A;  45 min.  Marland Kitchen HYSTEROSCOPY WITH D & C N/A 09/26/2014   Procedure: DILATATION AND CURETTAGE /HYSTEROSCOPY/Hysteroscopic IUD Removal, NEXPLANON INSERTION ;  Surgeon: Servando [place], MD;  Location: [facility];  Service: Gynecology;  Laterality: N/A;  . SEPTOPLASTY    . WISDOM TOOTH EXTRACTION      Prior to Admission medications   Medication Sig Start Date End Date Taking?  Authorizing Provider  amLODipine (NORVASC) 10 MG tablet Take 1 tablet (10 mg total) by mouth daily. 09/05/19   Jearld Fenton, NP  atorvastatin (LIPITOR) 10 MG tablet Take 0.5 tablets (5 mg total) by mouth daily. 08/24/18   Jearld Fenton, NP  Butalbital-APAP-Caffeine 50-300-40 MG CAPS Take 1 tablet by mouth daily as needed. 01/17/15   Jearld Fenton, NP  etonogestrel (NEXPLANON) 68 MG IMPL implant 1 each by Subdermal route once. Inserted 10/08/2014    [provider]  fluticasone (FLONASE) 50 MCG/ACT nasal spray Place 1 spray into both nostrils daily as needed for allergies.  07/08/14   [provider]  HYDROcodone-acetaminophen (NORCO/VICODIN) 5-325 MG tablet Take 1 tablet by mouth every 4 (four) hours as needed for moderate pain. 10/06/19 10/05/20  Emeril Stille, Johnette Abraham B, FNP  hydrOXYzine (ATARAX/VISTARIL) 10 MG tablet TAKE 1 TABLET (10 MG TOTAL) BY MOUTH EVERY 12 (TWELVE) HOURS AS NEEDED. 11/27/18   Jearld Fenton, NP  metFORMIN (GLUCOPHAGE-XR) 500 MG 24 hr tablet Take 1 tablet by mouth daily after supper. 02/18/18   [provider]  naproxen (NAPROSYN) 500 MG tablet Take 1 tablet (500 mg total) by mouth 2 (two) times daily with a meal. 10/06/19   Joachim Carton B, FNP  oxymetazoline (AFRIN) 0.05 % nasal spray Place 2 sprays into both nostrils 2 (two) times daily as needed for congestion.    [provider]  sertraline (ZOLOFT)  25 MG tablet Take 1 tablet (25 mg total) by mouth daily. 10/24/18   Jearld Fenton, NP    Allergies Latex and Prednisone  Family History  Problem Relation Age of Onset  . Hypertension Mother   . Arthritis Maternal Aunt   . Arthritis Maternal Grandmother   . Cancer Maternal Grandmother        Breast  . Hypertension Maternal Grandmother     Social History Social History   Tobacco Use  . Smoking status: Never Smoker  . Smokeless tobacco: Never Used  Substance Use Topics  . Alcohol use: Yes    Alcohol/week: 1.0 standard drinks    Types:  1 Glasses of wine per week    Comment: occasional wine  . Drug use: No    Review of Systems  Constitutional: No fever/chills Eyes: No visual changes. ENT: No sore throat. Cardiovascular: Denies chest pain. Respiratory: Denies shortness of breath. Gastrointestinal: Positive abdominal pain.  Positive for nausea and vomiting no diarrhea.  No constipation. Genitourinary: Negative for dysuria. Musculoskeletal: Positive for right-sided back pain. Skin: Negative for rash. Neurological: Negative for headaches, focal weakness or numbness. ___________________________________________   PHYSICAL EXAM:  VITAL SIGNS: ED Triage Vitals  Enc Vitals Group     BP 10/06/19 1129 (!) 165/106     Pulse Rate 10/06/19 1129 98     Resp 10/06/19 1129 15     Temp 10/06/19 1129 (!) 97.5 F (36.4 C)     Temp Source 10/06/19 1129 Oral     SpO2 10/06/19 1129 98 %     Weight 10/06/19 1129 192 lb (87.1 kg)     Height 10/06/19 1129 5\' 3"  (1.6 m)     Head Circumference --      Peak Flow --      Pain Score 10/06/19 1140 6     Pain Loc --      Pain Edu? --      Excl. in [place]? --     Constitutional: Alert and oriented. Well appearing and in no acute distress. Eyes: Conjunctivae are normal. PERRL Head: Atraumatic. Nose: No congestion/rhinnorhea. Mouth/Throat: Mucous membranes are moist. Oropharynx non-erythematous. Neck: No stridor.   Hematological/Lymphatic/Immunilogical: No cervical lymphadenopathy. Cardiovascular: Normal rate, regular rhythm. Grossly normal heart sounds.  Good peripheral circulation. Respiratory: Normal respiratory effort.  No retractions. Lungs CTAB. Gastrointestinal: Soft and mildly tender in the right lower quadrant. No distention. No abdominal bruits. No CVA tenderness. Genitourinary:  Musculoskeletal: No lower extremity tenderness nor edema.  No joint effusions. Neurologic:  Normal speech and language. No gross focal neurologic deficits are appreciated. No gait  instability. Skin:  Skin is warm, dry and intact. No rash noted. Psychiatric: Mood and affect are normal. Speech and behavior are normal.  ____________________________________________   LABS (all labs ordered are listed, but only abnormal results are displayed)  Labs Reviewed  URINALYSIS, COMPLETE (UACMP) WITH MICROSCOPIC - Abnormal; Notable for the following components:      Result Value   Color, Urine YELLOW (*)    APPearance HAZY (*)    Glucose, UA 50 (*)    Ketones, ur 5 (*)    Protein, ur >=300 (*)    All other components within normal limits  CBC - Abnormal; Notable for the following components:   RBC 5.15 (*)    Platelets 443 (*)    All other components within normal limits  BASIC METABOLIC PANEL - Abnormal; Notable for the following components:   Potassium 3.3 (*)  Glucose, Bld 166 (*)    Calcium 8.6 (*)    All other components within normal limits  POC URINE PREG, ED  POCT PREGNANCY, URINE   ____________________________________________  EKG  Not indicated ____________________________________________  RADIOLOGY  ED MD interpretation:    4.3 cm right ovarian cyst noted on CT.  Otherwise, CT is normal and does not show any indication of stone disease.  3.8 x 3.1 x 3.2 likely hemorrhagic ovarian cyst on [place] as well as uterine fibroid.  Official radiology report(s): CT Renal Stone Study  Result Date: 10/06/2019 CLINICAL DATA:  Acute right flank pain. EXAM: CT ABDOMEN AND PELVIS WITHOUT CONTRAST TECHNIQUE: Multidetector CT imaging of the abdomen and pelvis was performed following the standard protocol without IV contrast. COMPARISON:  None. FINDINGS: Lower chest: No acute abnormality. Hepatobiliary: No focal liver abnormality is seen. No gallstones, gallbladder wall thickening, or biliary dilatation. Pancreas: Unremarkable. No pancreatic ductal dilatation or surrounding inflammatory changes. Spleen: Normal in size without focal abnormality. Adrenals/Urinary Tract:  Adrenal glands are unremarkable. Kidneys are normal, without renal calculi, focal lesion, or hydronephrosis. Bladder is unremarkable. Stomach/Bowel: Stomach is within normal limits. Appendix appears normal. No evidence of bowel wall thickening, distention, or inflammatory changes. Vascular/Lymphatic: No significant vascular findings are present. No enlarged abdominal or pelvic lymph nodes. Reproductive: Intrauterine device is noted. 4.3 cm right cystic abnormality is noted. Other: No abdominal wall hernia or abnormality. No abdominopelvic ascites. Musculoskeletal: No acute or significant osseous findings. IMPRESSION: 1. 4.3 cm right ovarian cystic abnormality is noted. Pelvic ultrasound is recommended for further evaluation. 2. No other abnormality seen in the abdomen or pelvis. Electronically Signed   By: Marijo Conception M.D.   On: 10/06/2019 14:19   US PELVIC COMPLETE WITH TRANSVAGINAL  Result Date: 10/06/2019 CLINICAL DATA:  Evaluate right ovarian cyst EXAM: TRANSABDOMINAL AND TRANSVAGINAL ULTRASOUND OF PELVIS TECHNIQUE: Both transabdominal and transvaginal ultrasound examinations of the pelvis were performed. Transabdominal technique was performed for global imaging of the pelvis including uterus, ovaries, adnexal regions, and pelvic cul-de-sac. It was necessary to proceed with endovaginal exam following the transabdominal exam to visualize the adnexa. COMPARISON:  CT 10/06/2019 FINDINGS: Uterus Measurements: 7.2 x 5.6 x 5.1 cm = volume: 107 mL. Retroverted. Intramural fundal fibroid measuring 2.7 x 2.5 x 2.8 cm. Endometrium Thickness: 4 mm.  IUD in place.  No focal abnormality visualized. Right ovary Measurements: 4.3 x 3.9 x 3.8 cm = volume: 34 mL. Complex cyst with a few thin internal septations and a small nonvascular area of nodular hyperechogenicity along the wall likely reflecting a retracting clot within a hemorrhagic cyst (series 1, image 76). Cyst measures 3.8 x 3.1 x 3.2 cm. Left ovary  Measurements: 2.5 x 1.5 x 0.9 cm = volume: 2 mL. Normal appearance/no adnexal mass. Other findings Trace free fluid within the posterior cul-de-sac. IMPRESSION: 1. Complex 3.8 cm ovarian cyst with imaging features most compatible with a hemorrhagic cyst. This has benign characteristics and is a common finding in premenopausal females. No imaging follow up is required. This follows consensus guidelines: Simple Adnexal Cysts: SRU Consensus Conference Update on Follow-up and Reporting. Radiology 2019; [id number]. 2. Single 2.8 cm uterine fibroid. 3. IUD in place. Electronically Signed   By: Davina Poke D.O.   On: 10/06/2019 15:51    ____________________________________________   PROCEDURES  Procedure(s) performed (including Critical Care):  Procedures  ____________________________________________   INITIAL IMPRESSION / ASSESSMENT AND PLAN     44 year old female presenting to the emergency department for treatment  and evaluation of right flank pain that now radiates into the right lower quadrant.  Pain is intermittent.  She has no dysuria.  No history of stone disease however history and exam is concerning.    DIFFERENTIAL DIAGNOSIS  Nephrolithiasis, ureterolithiasis, pyelonephritis, ovarian cyst  ED COURSE  CT shows a large 4.3 cm ovarian cyst on the right side.  Radiologist recommendation of ultrasound has been ordered.  No stone disease noted on CT.  Her urinalysis does show that she has some hemoglobin and protein in the urine, however she is currently on her menstrual cycle.  US shows 3.8 X 3.1 X 3.1 cyst that is likely hemorrhagic as well as uterine fibroids.  Will consult with gynecology.  Case discussed with Dr. Amalia Hailey who advises symptomatic treatment today and follow-up with gynecology in outpatient setting for additional work-up.  Patient has an appointment with her gynecologist in [place] this coming week.  Plan today will be to discharge her home with pain medication  and strict ER return precautions.  She is aware of the importance of keeping her follow-up appointment. ____________________________________________   FINAL CLINICAL IMPRESSION(S) / ED DIAGNOSES  Final diagnoses:  Hemorrhagic ovarian cyst  Uterine leiomyoma, unspecified location     ED Discharge Orders         Ordered    HYDROcodone-acetaminophen (NORCO/VICODIN) 5-325 MG tablet  Every 4 hours PRN     10/06/19 1659    naproxen (NAPROSYN) 500 MG tablet  2 times daily with meals     10/06/19 1659          Martha Randall was evaluated in Emergency Department on 10/06/2019 for the symptoms described in the history of present illness. She was evaluated in the context of the global COVID-19 pandemic, which necessitated consideration that the patient might be at risk for infection with the SARS-CoV-2 virus that causes COVID-19. Institutional protocols and algorithms that pertain to the evaluation of patients at risk for COVID-19 are in a state of rapid change based on information released by regulatory bodies including the CDC and federal and state organizations. These policies and algorithms were followed during the patient's care in the ED.   Note:  This document was prepared using Dragon voice recognition software and may include unintentional dictation errors.   Victorino Dike, FNP 10/06/19 1750    Earleen Newport, MD 10/07/19 1058

## 2019-10-06 NOTE — ED Triage Notes (Signed)
PT arrived via POV with c/o of RLQ abdominal and RIght flank pain that started today. Pt c/o vomiting x 1 and has not had anything to eat today.  Pt states she has IUD in place as well for several years and states its due to come out.

## 2019-10-11 ENCOUNTER — Encounter: Payer: Self-pay | Admitting: Gastroenterology

## 2019-10-11 ENCOUNTER — Encounter: Payer: Self-pay | Admitting: Internal Medicine

## 2019-10-11 ENCOUNTER — Ambulatory Visit (INDEPENDENT_AMBULATORY_CARE_PROVIDER_SITE_OTHER): Payer: 59 | Admitting: Internal Medicine

## 2019-10-11 ENCOUNTER — Other Ambulatory Visit: Payer: Self-pay

## 2019-10-11 VITALS — BP 128/82 | HR 83 | Temp 97.7°F | Ht 63.0 in | Wt 194.0 lb

## 2019-10-11 DIAGNOSIS — E119 Type 2 diabetes mellitus without complications: Secondary | ICD-10-CM | POA: Diagnosis not present

## 2019-10-11 DIAGNOSIS — J4520 Mild intermittent asthma, uncomplicated: Secondary | ICD-10-CM | POA: Diagnosis not present

## 2019-10-11 DIAGNOSIS — R519 Headache, unspecified: Secondary | ICD-10-CM

## 2019-10-11 DIAGNOSIS — G4733 Obstructive sleep apnea (adult) (pediatric): Secondary | ICD-10-CM

## 2019-10-11 DIAGNOSIS — Z1211 Encounter for screening for malignant neoplasm of colon: Secondary | ICD-10-CM | POA: Diagnosis not present

## 2019-10-11 DIAGNOSIS — Z0000 Encounter for general adult medical examination without abnormal findings: Secondary | ICD-10-CM

## 2019-10-11 DIAGNOSIS — F419 Anxiety disorder, unspecified: Secondary | ICD-10-CM

## 2019-10-11 DIAGNOSIS — F5104 Psychophysiologic insomnia: Secondary | ICD-10-CM

## 2019-10-11 DIAGNOSIS — G4700 Insomnia, unspecified: Secondary | ICD-10-CM | POA: Insufficient documentation

## 2019-10-11 DIAGNOSIS — F32A Depression, unspecified: Secondary | ICD-10-CM

## 2019-10-11 DIAGNOSIS — E785 Hyperlipidemia, unspecified: Secondary | ICD-10-CM | POA: Insufficient documentation

## 2019-10-11 DIAGNOSIS — I10 Essential (primary) hypertension: Secondary | ICD-10-CM

## 2019-10-11 DIAGNOSIS — F329 Major depressive disorder, single episode, unspecified: Secondary | ICD-10-CM

## 2019-10-11 DIAGNOSIS — E7800 Pure hypercholesterolemia, unspecified: Secondary | ICD-10-CM

## 2019-10-11 NOTE — Assessment & Plan Note (Signed)
Will request recent labs Continue Atorvastatin

## 2019-10-11 NOTE — Assessment & Plan Note (Signed)
Encouraged Melatonin

## 2019-10-11 NOTE — Assessment & Plan Note (Signed)
Continue Hydroxyzine Will d/c Sertraline due to nonuse

## 2019-10-11 NOTE — Assessment & Plan Note (Signed)
Not currently using CPAP Not sleeping well- encouraged Melatonin or Lavender diffuser Will monitor

## 2019-10-11 NOTE — Assessment & Plan Note (Signed)
No issues Will monitor

## 2019-10-11 NOTE — Patient Instructions (Signed)

## 2019-10-11 NOTE — Assessment & Plan Note (Signed)
Resolved with diet changes Will monitor

## 2019-10-11 NOTE — Assessment & Plan Note (Signed)
Will request recent labs from [place] Encouraged her to consume a low carb diet, exercise for weight loss Continue Metformin Foot exam today Encouraged routine eye exam Will get pneumovax at next visit

## 2019-10-11 NOTE — Progress Notes (Signed)
Subjective:    Patient ID: Martha Randall, female    DOB: 1975/11/19, 44 y.o.   MRN: [id number]  HPI  Pt presents to the clinic today for her annual exam. She is also due to follow up chronic conditions.  Asthma: No issues. She does not use any inhalers. There are no PFT's on file.   OSA: She has not been wearing her CPAP at night. She averages 4 hours of sleep per night. There is no sleep study on file.  HTN: Her BP today is 128/82. She is taking Amlodipine as prescribed. ECG from 12/2016 reviewed.  Migraines: Not an issues since stopping meat. She takes Fioricet as needed with good relief of symptoms. She does not follow with neurology.  Depression: Chronic, but stable on Hydroxyzine. She has Hydroxyzine but is not taking it. She is not seeing a therapist. She denies anxiety, SI/HI.  DM 2: Her last A1C was 6.8, 09/2018. She is taking Metformin as prescribed. She does not monitor her sugars. She checks her feet routinely. Her last eye exam was 09/2019. She follows with endocrinology.  HLD: Her last LDL was 68, 10/2018. She denies myalgias on Atorvastatin. She tries to consume a low fat diet.  Insomnia: She has trouble staying asleep. She is falling asleep ok. She is not taking any medication OTC for this.  Flu: 03/2019 Tetanus: 07/2019 Pneumovax:  Covid: 09/2019 Pap Smear: 07/2018 Mammogram: [facility] Colon Screening: never Vision Screening: 09/2019 [place] Dentist: biannually  Diet: She does not eat meat. She consumes fruits and veggies daily. She tries to avoid fried foods. She drinks mostly water, coffee Exercise: Walking stairs, neighborhood, and park.  Review of Systems      Past Medical History:  Diagnosis Date  . Allergy   . Chicken pox   . COVID-19   . Depression   . Frequent headaches    otc med prn  . History of blood transfusion 07/2009   [place] - 2 units transfused  . Hypertension   . Sleep apnea    uses cpap nightly    Current Outpatient  Medications  Medication Sig Dispense Refill  . amLODipine (NORVASC) 10 MG tablet Take 1 tablet (10 mg total) by mouth daily. 30 tablet 1  . atorvastatin (LIPITOR) 10 MG tablet Take 0.5 tablets (5 mg total) by mouth daily. 45 tablet 0  . Butalbital-APAP-Caffeine 50-300-40 MG CAPS Take 1 tablet by mouth daily as needed. 30 capsule 0  . etonogestrel (NEXPLANON) 68 MG IMPL implant 1 each by Subdermal route once. Inserted 10/08/2014    . fluticasone (FLONASE) 50 MCG/ACT nasal spray Place 1 spray into both nostrils daily as needed for allergies.   0  . HYDROcodone-acetaminophen (NORCO/VICODIN) 5-325 MG tablet Take 1 tablet by mouth every 4 (four) hours as needed for moderate pain. 20 tablet 0  . hydrOXYzine (ATARAX/VISTARIL) 10 MG tablet TAKE 1 TABLET (10 MG TOTAL) BY MOUTH EVERY 12 (TWELVE) HOURS AS NEEDED. 180 tablet 0  . metFORMIN (GLUCOPHAGE-XR) 500 MG 24 hr tablet Take 1 tablet by mouth daily after supper.    . naproxen (NAPROSYN) 500 MG tablet Take 1 tablet (500 mg total) by mouth 2 (two) times daily with a meal. 30 tablet 0  . oxymetazoline (AFRIN) 0.05 % nasal spray Place 2 sprays into both nostrils 2 (two) times daily as needed for congestion.    . sertraline (ZOLOFT) 25 MG tablet Take 1 tablet (25 mg total) by mouth daily. 30 tablet 2  No current facility-administered medications for this visit.    Allergies  Allergen Reactions  . Latex Hives    Vaginal reaction only  . Prednisone Swelling, Rash and Other (See Comments)    Elevated blood pressure    Family History  Problem Relation Age of Onset  . Hypertension Mother   . Arthritis Maternal Aunt   . Arthritis Maternal Grandmother   . Cancer Maternal Grandmother        Breast  . Hypertension Maternal Grandmother     Social History   Socioeconomic History  . Marital status: Married    Spouse name: Not on file  . Number of children: Not on file  . Years of education: Not on file  . Highest education level: Not on file    Occupational History  . 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 Smoking status: Never Smoker  . Smokeless tobacco: Never Used  Substance and Sexual Activity  . Alcohol use: Yes    Alcohol/week: 1.0 standard drinks    Types: 1 Glasses of wine per week    Comment: occasional wine  . Drug use: No  . Sexual activity: Yes    Birth control/protection: Implant    Comment: nexplanon  Other Topics Concern  . Not on file  Social History Narrative  . Not on file   Social Determinants of Health   Financial Resource Strain:   . Difficulty of Paying Living Expenses:   Food Insecurity:   . Worried About Charity fundraiser in the Last Year:   . Arboriculturist in the Last Year:   Transportation Needs:   . Film/video editor (Medical):   Marland Kitchen Lack of Transportation (Non-Medical):   Physical Activity:   . Days of Exercise per Week:   . Minutes of Exercise per Session:   Stress:   . Feeling of Stress :   Social Connections:   . Frequency of Communication with Friends and Family:   . Frequency of Social Gatherings with Friends and Family:   . Attends Religious Services:   . Active Member of Clubs or Organizations:   . Attends Archivist Meetings:   Marland Kitchen Marital Status:   Intimate Partner Violence:   . Fear of Current or Ex-Partner:   . Emotionally Abused:   Marland Kitchen Physically Abused:   . Sexually Abused:      Constitutional: Denies fever, malaise, fatigue, headache or abrupt weight changes.  HEENT: Denies eye pain, eye redness, ear pain, ringing in the ears, wax buildup, runny nose, nasal congestion, bloody nose, or sore throat. Respiratory: Denies difficulty breathing, shortness of breath, cough or sputum production.   Cardiovascular: Denies chest pain, chest tightness, palpitations or swelling in the hands or feet.  Gastrointestinal: Denies abdominal pain, bloating, constipation, diarrhea or blood in the stool.  GU: Denies urgency, frequency, pain  with urination, burning sensation, blood in urine, odor or discharge. Musculoskeletal: Denies decrease in range of motion, difficulty with gait, muscle pain or joint pain and swelling.  Skin: Denies redness, rashes, lesions or ulcercations.  Neurological: Denies dizziness, difficulty with memory, difficulty with speech or problems with balance and coordination.  Psych: Pt has a history of anxiety and depression. Denies SI/HI.  No other specific complaints in a complete review of systems (except as listed in HPI above).  Objective:   Physical Exam  BP 128/82   Pulse 83   Temp 97.7 F (36.5 C) (Temporal)   Ht 5'  3" (1.6 m)   Wt 194 lb (88 kg)   SpO2 98%   BMI 34.37 kg/m   Wt Readings from Last 3 Encounters:  10/06/19 192 lb (87.1 kg)  08/01/18 201 lb (91.2 kg)  12/29/16 206 lb (93.4 kg)    General: Appears her stated age, obese, in NAD. Skin: Warm, dry and intact. No ulcerations noted. HEENT: Head: normal shape and size; Eyes: sclera white, no icterus, conjunctiva pink, PERRLA and EOMs intact;  Neck:  Neck supple, trachea midline. No masses, lumps or thyromegaly present.  Cardiovascular: Normal rate and rhythm. S1,S2 noted.  No murmur, rubs or gallops noted. No JVD or BLE edema. No carotid bruits noted. Pulmonary/Chest: Normal effort and positive vesicular breath sounds. No respiratory distress. No wheezes, rales or ronchi noted.  Abdomen: Soft and mild RLQ tenderness- has hemorraghic cyst.. Normal bowel sounds. No distention or masses noted. Liver, spleen and kidneys non palpable. Musculoskeletal: Strength 5/5 BUE/BLE. No difficulty with gait.  Neurological: Alert and oriented. Cranial nerves II-XII grossly intact. Coordination normal.  Psychiatric: Mood and affect normal. Behavior is normal. Judgment and thought content normal.    BMET    Component Value Date/Time   NA 140 10/06/2019 1150   K 3.3 (L) 10/06/2019 1150   CL 104 10/06/2019 1150   CO2 27 10/06/2019 1150    GLUCOSE 166 (H) 10/06/2019 1150   BUN 10 10/06/2019 1150   CREATININE 0.86 10/06/2019 1150   CREATININE 0.93 01/17/2015 1524   CALCIUM 8.6 (L) 10/06/2019 1150   GFRNONAA >60 10/06/2019 1150   GFRNONAA >60 02/14/2014 1205   GFRAA >60 10/06/2019 1150   GFRAA >60 02/14/2014 1205    Lipid Panel     Component Value Date/Time   CHOL 127 10/31/2018 0831   TRIG 56.0 10/31/2018 0831   HDL 48.40 10/31/2018 0831   CHOLHDL 3 10/31/2018 0831   VLDL 11.2 10/31/2018 0831   LDLCALC 68 10/31/2018 0831    CBC    Component Value Date/Time   WBC 8.8 10/06/2019 1150   RBC 5.15 (H) 10/06/2019 1150   HGB 14.2 10/06/2019 1150   HCT 43.0 10/06/2019 1150   PLT 443 (H) 10/06/2019 1150   MCV 83.5 10/06/2019 1150   MCH 27.6 10/06/2019 1150   MCHC 33.0 10/06/2019 1150   RDW 12.5 10/06/2019 1150   LYMPHSABS 3,815 10/29/2016 1639   MONOABS 545 10/29/2016 1639   EOSABS 109 10/29/2016 1639   BASOSABS 0 10/29/2016 1639    Hgb A1C Lab Results  Component Value Date   HGBA1C 7.1 (H) 08/01/2018            Assessment & Plan:   Preventative Health Maintenance:  Encouraged her to get a flu shot in the fall Tetanus UTD Pneumovax- will get at next visit Pap smear UTD- will request copy Mammogram UTD- will request copy Referral to GI for screening colonoscopy Encouraged him to consume a balanced diet and exercise regimen Advised her to see an eye doctor and dentist annually Will request copy of recent labs  RTC in 6 months, chronic conditions Webb Silversmith, NP This visit occurred during the SARS-CoV-2 public health emergency.  Safety protocols were in place, including screening questions prior to the visit, additional usage of staff PPE, and extensive cleaning of exam room while observing appropriate contact time as indicated for disinfecting solutions.

## 2019-10-12 ENCOUNTER — Encounter: Payer: Self-pay | Admitting: Internal Medicine

## 2019-10-30 ENCOUNTER — Ambulatory Visit: Payer: 59 | Attending: Family

## 2019-10-30 ENCOUNTER — Ambulatory Visit (AMBULATORY_SURGERY_CENTER): Payer: Self-pay

## 2019-10-30 ENCOUNTER — Other Ambulatory Visit: Payer: Self-pay

## 2019-10-30 VITALS — Temp 97.4°F | Ht 63.0 in | Wt 198.6 lb

## 2019-10-30 DIAGNOSIS — Z23 Encounter for immunization: Secondary | ICD-10-CM

## 2019-10-30 DIAGNOSIS — Z1211 Encounter for screening for malignant neoplasm of colon: Secondary | ICD-10-CM

## 2019-10-30 MED ORDER — NA SULFATE-K SULFATE-MG SULF 17.5-3.13-1.6 GM/177ML PO SOLN: 1.0000 | Freq: Once | ORAL | 0 refills | Status: AC

## 2019-10-30 NOTE — Progress Notes (Signed)
No allergies to soy or egg Pt is not on blood thinners or diet pills Denies issues with sedation/intubation Denies atrial flutter/fib Denies constipation   Emmi instructions given to pt  Pt is aware of Covid safety and care partner requirements.  

## 2019-10-30 NOTE — Progress Notes (Signed)
   Covid-19 Vaccination Clinic  Name:  Martha Randall    MRN: [id number] DOB: [birth year]  10/30/2019  Martha Randall was observed post Covid-19 immunization for 15 minutes without incident. She was provided with Vaccine Information Sheet and instruction to access the V-Safe system.   Martha Randall was instructed to call 911 with any severe reactions post vaccine: Marland Kitchen Difficulty breathing  . Swelling of face and throat  . A fast heartbeat  . A bad rash all over body  . Dizziness and weakness   Immunizations Administered    Name Date Dose VIS Date Route   Moderna COVID-19 Vaccine 10/30/2019 10:24 AM 0.5 mL 06/19/2019 Intramuscular   Manufacturer: Moderna   Lot: [id number]   [id number]

## 2019-11-09 ENCOUNTER — Encounter: Payer: Self-pay | Admitting: Gastroenterology

## 2019-11-13 ENCOUNTER — Encounter: Payer: Self-pay | Admitting: Gastroenterology

## 2019-11-13 ENCOUNTER — Ambulatory Visit (AMBULATORY_SURGERY_CENTER): Payer: 59 | Admitting: Gastroenterology

## 2019-11-13 ENCOUNTER — Other Ambulatory Visit: Payer: Self-pay

## 2019-11-13 VITALS — BP 128/85 | HR 70 | Temp 97.5°F | Resp 13 | Ht 63.0 in | Wt 198.0 lb

## 2019-11-13 DIAGNOSIS — D120 Benign neoplasm of cecum: Secondary | ICD-10-CM

## 2019-11-13 DIAGNOSIS — Z1211 Encounter for screening for malignant neoplasm of colon: Secondary | ICD-10-CM

## 2019-11-13 DIAGNOSIS — K635 Polyp of colon: Secondary | ICD-10-CM | POA: Diagnosis not present

## 2019-11-13 MED ORDER — SODIUM CHLORIDE 0.9 % IV SOLN: 500.0000 mL | Freq: Once | INTRAVENOUS | Status: DC

## 2019-11-13 NOTE — Progress Notes (Signed)
pt tolerated well. VSS. awake and to recovery. Report given to RN.  

## 2019-11-13 NOTE — Progress Notes (Signed)
Called to room to assist during endoscopic procedure.  Patient ID and intended procedure confirmed with present staff. Received instructions for my participation in the procedure from the performing physician.  

## 2019-11-13 NOTE — Op Note (Signed)
[place] Patient Name: Martha Randall Procedure Date: 11/13/2019 10:56 AM MRN: [id number] Endoscopist: Mauri Pole , MD Age: 44 Referring MD:  Date of Birth: 09/03/1975 Gender: Female Account #: [id number] Procedure:                Colonoscopy Indications:              Screening for colorectal malignant neoplasm Medicines:                Monitored Anesthesia Care Procedure:                Pre-Anesthesia Assessment:                           - Prior to the procedure, a History and Physical                            was performed, and patient medications and                            allergies were reviewed. The patient's tolerance of                            previous anesthesia was also reviewed. The risks                            and benefits of the procedure and the sedation                            options and risks were discussed with the patient.                            All questions were answered, and informed consent                            was obtained. Prior Anticoagulants: The patient has                            taken no previous anticoagulant or antiplatelet                            agents. ASA Grade Assessment: II - A patient with                            mild systemic disease. After reviewing the risks                            and benefits, the patient was deemed in                            satisfactory condition to undergo the procedure.                           After obtaining informed consent, the colonoscope  was passed under direct vision. Throughout the                            procedure, the patient's blood pressure, pulse, and                            oxygen saturations were monitored continuously. The                            Colonoscope was introduced through the anus and                            advanced to the the cecum, identified by                            appendiceal  orifice and ileocecal valve. The                            colonoscopy was performed without difficulty. The                            patient tolerated the procedure well. The quality                            of the bowel preparation was excellent. The                            ileocecal valve, appendiceal orifice, and rectum                            were photographed. Scope In: 11:12:56 AM Scope Out: 11:28:11 AM Scope Withdrawal Time: 0 hours 9 minutes 55 seconds  Total Procedure Duration: 0 hours 15 minutes 15 seconds  Findings:                 The perianal and digital rectal examinations were                            normal.                           A 5 mm polyp was found in the cecum. The polyp was                            flat. The polyp was removed with a cold snare.                            Resection and retrieval were complete.                           Non-bleeding internal hemorrhoids were found during                            retroflexion. The hemorrhoids were small.  The exam was otherwise without abnormality. Complications:            No immediate complications. Estimated Blood Loss:     Estimated blood loss was minimal. Impression:               - One 5 mm polyp in the cecum, removed with a cold                            snare. Resected and retrieved.                           - Non-bleeding internal hemorrhoids.                           - The examination was otherwise normal. Recommendation:           - Patient has a contact number available for                            emergencies. The signs and symptoms of potential                            delayed complications were discussed with the                            patient. Return to normal activities tomorrow.                            Written discharge instructions were provided to the                            patient.                           - Resume previous diet.                            - Continue present medications.                           - Await pathology results.                           - Repeat colonoscopy in 5-10 years for surveillance                            based on pathology results. Mauri Pole, MD 11/13/2019 11:31:53 AM This report has been signed electronically.

## 2019-11-13 NOTE — Patient Instructions (Signed)
Handouts Provided:  Polyps  YOU HAD AN ENDOSCOPIC PROCEDURE TODAY AT THE [facility]:   Refer to the procedure report that was given to you for any specific questions about what was found during the examination.  If the procedure report does not answer your questions, please call your gastroenterologist to clarify.  If you requested that your care partner not be given the details of your procedure findings, then the procedure report has been included in a sealed envelope for you to review at your convenience later.  YOU SHOULD EXPECT: Some feelings of bloating in the abdomen. Passage of more gas than usual.  Walking can help get rid of the air that was put into your GI tract during the procedure and reduce the bloating. If you had a lower endoscopy (such as a colonoscopy or flexible sigmoidoscopy) you may notice spotting of blood in your stool or on the toilet paper. If you underwent a bowel prep for your procedure, you may not have a normal bowel movement for a few days.  Please Note:  You might notice some irritation and congestion in your nose or some drainage.  This is from the oxygen used during your procedure.  There is no need for concern and it should clear up in a day or so.  SYMPTOMS TO REPORT IMMEDIATELY:   Following lower endoscopy (colonoscopy or flexible sigmoidoscopy):  Excessive amounts of blood in the stool  Significant tenderness or worsening of abdominal pains  Swelling of the abdomen that is new, acute  Fever of 100F or higher  For urgent or emergent issues, a gastroenterologist can be reached at any hour by calling [phone]. Do not use MyChart messaging for urgent concerns.    DIET:  We do recommend a small meal at first, but then you may proceed to your regular diet.  Drink plenty of fluids but you should avoid alcoholic beverages for 24 hours.  ACTIVITY:  You should plan to take it easy for the rest of today and you should NOT DRIVE or use heavy  machinery until tomorrow (because of the sedation medicines used during the test).    FOLLOW UP: Our staff will call the number listed on your records 48-72 hours following your procedure to check on you and address any questions or concerns that you may have regarding the information given to you following your procedure. If we do not reach you, we will leave a message.  We will attempt to reach you two times.  During this call, we will ask if you have developed any symptoms of COVID 19. If you develop any symptoms (ie: fever, flu-like symptoms, shortness of breath, cough etc.) before then, please call [phone].  If you test positive for Covid 19 in the 2 weeks post procedure, please call and report this information to us.    If any biopsies were taken you will be contacted by phone or by letter within the next 1-3 weeks.  Please call us at [phone] if you have not heard about the biopsies in 3 weeks.    SIGNATURES/CONFIDENTIALITY: You and/or your care partner have signed paperwork which will be entered into your electronic medical record.  These signatures attest to the fact that that the information above on your After Visit Summary has been reviewed and is understood.  Full responsibility of the confidentiality of this discharge information lies with you and/or your care-partner.  

## 2019-11-13 NOTE — Progress Notes (Signed)
Pt's states no medical or surgical changes since previsit or office visit. 

## 2019-11-15 ENCOUNTER — Telehealth: Payer: Self-pay

## 2019-11-15 NOTE — Telephone Encounter (Signed)
  Follow up Call-  Call back number 11/13/2019  Post procedure Call Back phone  # [phone]  Permission to leave phone message Yes  Some recent data might be hidden     Patient questions:  Do you have a fever, pain , or abdominal swelling? No. Pain Score  0 *  Have you tolerated food without any problems? Yes.    Have you been able to return to your normal activities? Yes.    Do you have any questions about your discharge instructions: Diet   No. Medications  No. Follow up visit  No.  Do you have questions or concerns about your Care? No.  Actions: * If pain score is 4 or above: No action needed, pain <4.  1. Have you developed a fever since your procedure? no  2.   Have you had an respiratory symptoms (SOB or cough) since your procedure? no  3.   Have you tested positive for COVID 19 since your procedure no  4.   Have you had any family members/close contacts diagnosed with the COVID 19 since your procedure?  no   If yes to any of these questions please route to Joylene John, RN and Erenest Rasher, RN

## 2019-11-20 ENCOUNTER — Encounter: Payer: Self-pay | Admitting: Gastroenterology

## 2019-12-01 ENCOUNTER — Other Ambulatory Visit: Payer: Self-pay | Admitting: Internal Medicine

## 2020-03-18 ENCOUNTER — Encounter: Payer: Self-pay | Admitting: Internal Medicine

## 2020-03-20 ENCOUNTER — Ambulatory Visit: Payer: 59 | Admitting: Internal Medicine

## 2020-03-20 ENCOUNTER — Other Ambulatory Visit: Payer: Self-pay

## 2020-03-20 ENCOUNTER — Encounter: Payer: Self-pay | Admitting: Internal Medicine

## 2020-03-20 VITALS — BP 136/96 | HR 64 | Temp 97.8°F | Ht 63.5 in | Wt 201.0 lb

## 2020-03-20 DIAGNOSIS — R1013 Epigastric pain: Secondary | ICD-10-CM | POA: Diagnosis not present

## 2020-03-20 DIAGNOSIS — R1012 Left upper quadrant pain: Secondary | ICD-10-CM

## 2020-03-20 DIAGNOSIS — M549 Dorsalgia, unspecified: Secondary | ICD-10-CM

## 2020-03-20 LAB — COMPREHENSIVE METABOLIC PANEL
ALT: 11 U/L (ref 0–35)
AST: 12 U/L (ref 0–37)
Albumin: 3.8 g/dL (ref 3.5–5.2)
Alkaline Phosphatase: 99 U/L (ref 39–117)
BUN: 10 mg/dL (ref 6–23)
CO2: 30 mEq/L (ref 19–32)
Calcium: 9.1 mg/dL (ref 8.4–10.5)
Chloride: 102 mEq/L (ref 96–112)
Creatinine, Ser: 0.78 mg/dL (ref 0.40–1.20)
GFR: 96.49 mL/min (ref >60.00)
Glucose, Bld: 103 mg/dL — ABNORMAL HIGH (ref 70–99)
Potassium: 3.6 mEq/L (ref 3.5–5.1)
Sodium: 138 mEq/L (ref 135–145)
Total Bilirubin: 0.6 mg/dL (ref 0.2–1.2)
Total Protein: 6.9 g/dL (ref 6.0–8.3)

## 2020-03-20 LAB — CBC
HCT: 40.4 % (ref 36.0–46.0)
Hemoglobin: 13.5 g/dL (ref 12.0–15.0)
MCHC: 33.5 g/dL (ref 30.0–36.0)
MCV: 83.3 fl (ref 78.0–100.0)
Platelets: 432 10*3/uL — ABNORMAL HIGH (ref 150.0–400.0)
RBC: 4.85 Mil/uL (ref 3.87–5.11)
RDW: 13.1 % (ref 11.5–15.5)
WBC: 8.4 10*3/uL (ref 4.0–10.5)

## 2020-03-20 LAB — AMYLASE: Amylase: 77 U/L (ref 27–131)

## 2020-03-20 LAB — LIPASE: Lipase: 34 U/L (ref 11.0–59.0)

## 2020-03-20 NOTE — Progress Notes (Signed)
Subjective:    Patient ID: Martha Randall, female    DOB: December 23, 1975, 44 y.o.   MRN: [id number]  HPI  Pt presents to the clinic today with c/o epigastric pain. This started 5 days ago. The pain starts in the epigastric region, radiates to the LUQ and left mid back. She describes the pain as a dull ache. She feels like the pain is worse after eating. She denies nausea, vomiting, reflux, constipation, diarrhea or blood in her stool. She denies chest pain, shortness of breath or cough. She has recently started working out but not doing anything strenuous. The pain is not worse movement. She reports increased stress at work some anxiety but denies depression. She has taken Ibuprofen and used her childs nebulizer with minimal relief.    Review of Systems      Past Medical History:  Diagnosis Date  . Allergy    seasonal  . Asthma    self report  . Chicken pox   . COVID-19   . Depression   . Diabetes mellitus without complication ([place])   . Frequent headaches    otc med prn  . History of blood transfusion 07/2009   [facility] - 2 units transfused  . Hypertension   . Sleep apnea    uses cpap nightly    Current Outpatient Medications  Medication Sig Dispense Refill  . amLODipine (NORVASC) 10 MG tablet TAKE 1 TABLET BY MOUTH EVERY DAY 30 tablet 4  . atorvastatin (LIPITOR) 10 MG tablet Take 0.5 tablets (5 mg total) by mouth daily. 45 tablet 0  . Butalbital-APAP-Caffeine 50-300-40 MG CAPS Take 1 tablet by mouth daily as needed. 30 capsule 0  . etonogestrel (NEXPLANON) 68 MG IMPL implant 1 each by Subdermal route once. Inserted 10/08/2014    . fluticasone (FLONASE) 50 MCG/ACT nasal spray Place 1 spray into both nostrils daily as needed for allergies.   0  . HYDROcodone-acetaminophen (NORCO/VICODIN) 5-325 MG tablet Take 1 tablet by mouth every 4 (four) hours as needed for moderate pain. 20 tablet 0  . hydrOXYzine (ATARAX/VISTARIL) 10 MG tablet TAKE 1 TABLET (10 MG TOTAL) BY MOUTH EVERY 12  (TWELVE) HOURS AS NEEDED. 180 tablet 0  . metFORMIN (GLUCOPHAGE-XR) 500 MG 24 hr tablet Take 1 tablet by mouth daily after supper.    Marland Kitchen oxymetazoline (AFRIN) 0.05 % nasal spray Place 2 sprays into both nostrils 2 (two) times daily as needed for congestion.    . sertraline (ZOLOFT) 25 MG tablet Take 1 tablet (25 mg total) by mouth daily. 30 tablet 2  . Vitamin D, Ergocalciferol, (DRISDOL) 1.25 MG (50000 UNIT) CAPS capsule Take 50,000 Units by mouth once a week.     No current facility-administered medications for this visit.    Allergies  Allergen Reactions  . Latex Hives    Vaginal reaction only  . Prednisone Swelling, Rash and Other (See Comments)    Elevated blood pressure    Family History  Problem Relation Age of Onset  . Hypertension Mother   . Arthritis Maternal Aunt   . Arthritis Maternal Grandmother   . Cancer Maternal Grandmother        Breast  . Hypertension Maternal Grandmother   . Colon cancer Neg Hx   . Colon polyps Neg Hx   . Esophageal cancer Neg Hx   . Rectal cancer Neg Hx   . Stomach cancer Neg Hx     Social History   Socioeconomic History  . Marital status: Married  Spouse name: Not on file  . Number of children: Not on file  . Years of education: Not on file  . Highest education level: Not on file  Occupational History  . 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 Smoking status: Never Smoker  . Smokeless tobacco: Never Used  Vaping Use  . Vaping Use: Never used  Substance and Sexual Activity  . Alcohol use: Yes    Alcohol/week: 1.0 standard drink    Types: 1 Glasses of wine per week    Comment: occasional wine  . Drug use: No  . Sexual activity: Yes    Birth control/protection: Implant    Comment: nexplanon  Other Topics Concern  . Not on file  Social History Narrative  . Not on file   Social Determinants of Health   Financial Resource Strain:   . Difficulty of Paying Living Expenses: Not on file  Food  Insecurity:   . Worried About Charity fundraiser in the Last Year: Not on file  . Ran Out of Food in the Last Year: Not on file  Transportation Needs:   . Lack of Transportation (Medical): Not on file  . Lack of Transportation (Non-Medical): Not on file  Physical Activity:   . Days of Exercise per Week: Not on file  . Minutes of Exercise per Session: Not on file  Stress:   . Feeling of Stress : Not on file  Social Connections:   . Frequency of Communication with Friends and Family: Not on file  . Frequency of Social Gatherings with Friends and Family: Not on file  . Attends Religious Services: Not on file  . Active Member of Clubs or Organizations: Not on file  . Attends Archivist Meetings: Not on file  . Marital Status: Not on file  Intimate Partner Violence:   . Fear of Current or Ex-Partner: Not on file  . Emotionally Abused: Not on file  . Physically Abused: Not on file  . Sexually Abused: Not on file     Constitutional: Denies fever, malaise, fatigue, headache or abrupt weight changes.  Respiratory: Denies difficulty breathing, shortness of breath, cough or sputum production.   Cardiovascular: Denies chest pain, chest tightness, palpitations or swelling in the hands or feet.  Gastrointestinal: Pt reports epigastric pain, LUQ pain. Denies  bloating, constipation, diarrhea or blood in the stool.  GU: Denies urgency, frequency, pain with urination, burning sensation, blood in urine, odor or discharge. Musculoskeletal: Pt reports left mid back pain. Denies decrease in range of motion, difficulty with gait, or joint pain and swelling.  Skin: Denies redness, rashes, lesions or ulcercations.  Neurological: Denies dizziness, difficulty with memory, difficulty with speech or problems with balance and coordination.  Psych: Pt reports stress and anxiety. Denies depression, SI/HI.  No other specific complaints in a complete review of systems (except as listed in HPI  above).  Objective:   Physical Exam   BP (!) 136/96   Pulse 64   Temp 97.8 F (36.6 C) (Temporal)   Ht 5' 3.5" (1.613 m)   Wt 201 lb (91.2 kg)   SpO2 97%   BMI 35.05 kg/m   Wt Readings from Last 3 Encounters:  11/13/19 198 lb (89.8 kg)  10/30/19 198 lb 9.6 oz (90.1 kg)  10/11/19 194 lb (88 kg)    General: Appears her stated age, obese, in NAD. Skin: Warm, dry and intact. No rashes noted. Neck:  Neck supple, trachea midline. No  masses, lumps or thyromegaly present.  Cardiovascular: Normal rate and rhythm. S1,S2 noted.  No murmur, rubs or gallops noted.  Pulmonary/Chest: Normal effort and positive vesicular breath sounds. No respiratory distress. No wheezes, rales or ronchi noted.  Abdomen: Soft and nontender. Normal bowel sounds. No distention or masses noted. No CVA tenderness noted. Liver, spleen and kidneys non palpable. Musculoskeletal: Normal ROM of the spine. No difficulty with gait.  Neurological: Alert and oriented.    BMET    Component Value Date/Time   NA 140 10/06/2019 1150   K 3.3 (L) 10/06/2019 1150   CL 104 10/06/2019 1150   CO2 27 10/06/2019 1150   GLUCOSE 166 (H) 10/06/2019 1150   BUN 10 10/06/2019 1150   CREATININE 0.86 10/06/2019 1150   CREATININE 0.93 01/17/2015 1524   CALCIUM 8.6 (L) 10/06/2019 1150   GFRNONAA >60 10/06/2019 1150   GFRNONAA >60 02/14/2014 1205   GFRAA >60 10/06/2019 1150   GFRAA >60 02/14/2014 1205    Lipid Panel     Component Value Date/Time   CHOL 127 10/31/2018 0831   TRIG 56.0 10/31/2018 0831   HDL 48.40 10/31/2018 0831   CHOLHDL 3 10/31/2018 0831   VLDL 11.2 10/31/2018 0831   LDLCALC 68 10/31/2018 0831    CBC    Component Value Date/Time   WBC 8.8 10/06/2019 1150   RBC 5.15 (H) 10/06/2019 1150   HGB 14.2 10/06/2019 1150   HCT 43.0 10/06/2019 1150   PLT 443 (H) 10/06/2019 1150   MCV 83.5 10/06/2019 1150   MCH 27.6 10/06/2019 1150   MCHC 33.0 10/06/2019 1150   RDW 12.5 10/06/2019 1150   LYMPHSABS 3,815  10/29/2016 1639   MONOABS 545 10/29/2016 1639   EOSABS 109 10/29/2016 1639   BASOSABS 0 10/29/2016 1639    Hgb A1C Lab Results  Component Value Date   HGBA1C 7.1 (H) 08/01/2018          Assessment & Plan:  Epigastric Pain, LUQ Pain, Left Mid Back Pain:  Will check CBC, CMET, Amylase, Lipase Consider CT abdomen if symptoms persist Consider Prilosec OTC daily x 2 weeks pending labs  Will follow up after labs, return precautions discussed  Webb Silversmith, NP This visit occurred during the SARS-CoV-2 public health emergency.  Safety protocols were in place, including screening questions prior to the visit, additional usage of staff PPE, and extensive cleaning of exam room while observing appropriate contact time as indicated for disinfecting solutions.

## 2020-03-20 NOTE — Patient Instructions (Signed)

## 2020-03-21 ENCOUNTER — Encounter: Payer: Self-pay | Admitting: Internal Medicine

## 2020-04-15 ENCOUNTER — Encounter: Payer: Self-pay | Admitting: Internal Medicine

## 2020-04-15 ENCOUNTER — Ambulatory Visit: Payer: 59 | Admitting: Internal Medicine

## 2020-04-15 ENCOUNTER — Other Ambulatory Visit: Payer: Self-pay

## 2020-04-15 VITALS — BP 124/84 | HR 83 | Temp 97.4°F | Wt 199.0 lb

## 2020-04-15 DIAGNOSIS — F419 Anxiety disorder, unspecified: Secondary | ICD-10-CM

## 2020-04-15 DIAGNOSIS — I10 Essential (primary) hypertension: Secondary | ICD-10-CM

## 2020-04-15 DIAGNOSIS — E119 Type 2 diabetes mellitus without complications: Secondary | ICD-10-CM | POA: Diagnosis not present

## 2020-04-15 DIAGNOSIS — G4733 Obstructive sleep apnea (adult) (pediatric): Secondary | ICD-10-CM

## 2020-04-15 DIAGNOSIS — F329 Major depressive disorder, single episode, unspecified: Secondary | ICD-10-CM

## 2020-04-15 DIAGNOSIS — Z23 Encounter for immunization: Secondary | ICD-10-CM | POA: Diagnosis not present

## 2020-04-15 DIAGNOSIS — E7800 Pure hypercholesterolemia, unspecified: Secondary | ICD-10-CM

## 2020-04-15 DIAGNOSIS — J4520 Mild intermittent asthma, uncomplicated: Secondary | ICD-10-CM | POA: Diagnosis not present

## 2020-04-15 DIAGNOSIS — R519 Headache, unspecified: Secondary | ICD-10-CM

## 2020-04-15 DIAGNOSIS — F32A Depression, unspecified: Secondary | ICD-10-CM

## 2020-04-15 DIAGNOSIS — F5104 Psychophysiologic insomnia: Secondary | ICD-10-CM

## 2020-04-15 LAB — CBC
HCT: 41.3 % (ref 36.0–46.0)
Hemoglobin: 13.7 g/dL (ref 12.0–15.0)
MCHC: 33.2 g/dL (ref 30.0–36.0)
MCV: 83 fl (ref 78.0–100.0)
Platelets: 410 10*3/uL — ABNORMAL HIGH (ref 150.0–400.0)
RBC: 4.97 Mil/uL (ref 3.87–5.11)
RDW: 13.2 % (ref 11.5–15.5)
WBC: 7.1 10*3/uL (ref 4.0–10.5)

## 2020-04-15 LAB — COMPREHENSIVE METABOLIC PANEL
ALT: 11 U/L (ref 0–35)
AST: 13 U/L (ref 0–37)
Albumin: 3.7 g/dL (ref 3.5–5.2)
Alkaline Phosphatase: 77 U/L (ref 39–117)
BUN: 7 mg/dL (ref 6–23)
CO2: 31 mEq/L (ref 19–32)
Calcium: 8.8 mg/dL (ref 8.4–10.5)
Chloride: 102 mEq/L (ref 96–112)
Creatinine, Ser: 0.81 mg/dL (ref 0.40–1.20)
GFR: 92.35 mL/min (ref >60.00)
Glucose, Bld: 111 mg/dL — ABNORMAL HIGH (ref 70–99)
Potassium: 3.5 mEq/L (ref 3.5–5.1)
Sodium: 137 mEq/L (ref 135–145)
Total Bilirubin: 0.8 mg/dL (ref 0.2–1.2)
Total Protein: 6.9 g/dL (ref 6.0–8.3)

## 2020-04-15 LAB — LIPID PANEL
Cholesterol: 174 mg/dL (ref 0–200)
HDL: 57.7 mg/dL (ref >39.00)
LDL Cholesterol: 104 mg/dL — ABNORMAL HIGH (ref 0–99)
NonHDL: 116.44
Total CHOL/HDL Ratio: 3
Triglycerides: 63 mg/dL (ref 0.0–149.0)
VLDL: 12.6 mg/dL (ref 0.0–40.0)

## 2020-04-15 LAB — MICROALBUMIN / CREATININE URINE RATIO
Creatinine,U: 120.2 mg/dL
Microalb Creat Ratio: 1.9 mg/g (ref 0.0–30.0)
Microalb, Ur: 2.3 mg/dL — ABNORMAL HIGH (ref 0.0–1.9)

## 2020-04-15 LAB — HEMOGLOBIN A1C: Hgb A1c MFr Bld: 7 % — ABNORMAL HIGH (ref 4.6–6.5)

## 2020-04-15 MED ORDER — ATORVASTATIN CALCIUM 10 MG PO TABS: 5.0000 mg | ORAL_TABLET | Freq: Every day | ORAL | 3 refills | Status: AC

## 2020-04-15 NOTE — Assessment & Plan Note (Signed)
Controlled on Amlodipine Reinforced DASH diet and exercise for weight loss CMET today

## 2020-04-15 NOTE — Assessment & Plan Note (Signed)
Currently not an issue She declines RX for Albuterol inhaler at this time

## 2020-04-15 NOTE — Assessment & Plan Note (Signed)
Encouraged her to take Sertraline as prescribed Continue Hydroxyzine prn Support offered

## 2020-04-15 NOTE — Patient Instructions (Signed)
Carbohydrate Counting for Diabetes Mellitus, Adult  Carbohydrate counting is a method of keeping track of how many carbohydrates you eat. Eating carbohydrates naturally increases the amount of sugar (glucose) in the blood. Counting how many carbohydrates you eat helps keep your blood glucose within normal limits, which helps you manage your diabetes (diabetes mellitus). It is important to know how many carbohydrates you can safely have in each meal. This is different for every person. A diet and nutrition specialist (registered dietitian) can help you make a meal plan and calculate how many carbohydrates you should have at each meal and snack. Carbohydrates are found in the following foods:  Grains, such as breads and cereals.  Dried beans and soy products.  Starchy vegetables, such as potatoes, peas, and corn.  Fruit and fruit juices.  Milk and yogurt.  Sweets and snack foods, such as cake, cookies, candy, chips, and soft drinks. How do I count carbohydrates? There are two ways to count carbohydrates in food. You can use either of the methods or a combination of both. Reading "Nutrition Facts" on packaged food The "Nutrition Facts" list is included on the labels of almost all packaged foods and beverages in the [place] It includes:  The serving size.  Information about nutrients in each serving, including the grams (g) of carbohydrate per serving. To use the "Nutrition Facts":  Decide how many servings you will have.  Multiply the number of servings by the number of carbohydrates per serving.  The resulting number is the total amount of carbohydrates that you will be having. Learning standard serving sizes of other foods When you eat carbohydrate foods that are not packaged or do not include "Nutrition Facts" on the label, you need to measure the servings in order to count the amount of carbohydrates:  Measure the foods that you will eat with a food scale or measuring cup, if  needed.  Decide how many standard-size servings you will eat.  Multiply the number of servings by 15. Most carbohydrate-rich foods have about 15 g of carbohydrates per serving. ? For example, if you eat 8 oz (170 g) of strawberries, you will have eaten 2 servings and 30 g of carbohydrates (2 servings x 15 g = 30 g).  For foods that have more than one food mixed, such as soups and casseroles, you must count the carbohydrates in each food that is included. The following list contains standard serving sizes of common carbohydrate-rich foods. Each of these servings has about 15 g of carbohydrates:   hamburger bun or  English muffin.   oz (15 mL) syrup.   oz (14 g) jelly.  1 slice of bread.  1 six-inch tortilla.  3 oz (85 g) cooked rice or pasta.  4 oz (113 g) cooked dried beans.  4 oz (113 g) starchy vegetable, such as peas, corn, or potatoes.  4 oz (113 g) hot cereal.  4 oz (113 g) mashed potatoes or  of a large baked potato.  4 oz (113 g) canned or frozen fruit.  4 oz (120 mL) fruit juice.  4-6 crackers.  6 chicken nuggets.  6 oz (170 g) unsweetened dry cereal.  6 oz (170 g) plain fat-free yogurt or yogurt sweetened with artificial sweeteners.  8 oz (240 mL) milk.  8 oz (170 g) fresh fruit or one small piece of fruit.  24 oz (680 g) popped popcorn. Example of carbohydrate counting Sample meal  3 oz (85 g) chicken breast.  6 oz (170 g)   brown rice.  4 oz (113 g) corn.  8 oz (240 mL) milk.  8 oz (170 g) strawberries with sugar-free whipped topping. Carbohydrate calculation 1. Identify the foods that contain carbohydrates: ? Rice. ? Corn. ? Milk. ? Strawberries. 2. Calculate how many servings you have of each food: ? 2 servings rice. ? 1 serving corn. ? 1 serving milk. ? 1 serving strawberries. 3. Multiply each number of servings by 15 g: ? 2 servings rice x 15 g = 30 g. ? 1 serving corn x 15 g = 15 g. ? 1 serving milk x 15 g = 15 g. ? 1  serving strawberries x 15 g = 15 g. 4. Add together all of the amounts to find the total grams of carbohydrates eaten: ? 30 g + 15 g + 15 g + 15 g = 75 g of carbohydrates total. Summary  Carbohydrate counting is a method of keeping track of how many carbohydrates you eat.  Eating carbohydrates naturally increases the amount of sugar (glucose) in the blood.  Counting how many carbohydrates you eat helps keep your blood glucose within normal limits, which helps you manage your diabetes.  A diet and nutrition specialist (registered dietitian) can help you make a meal plan and calculate how many carbohydrates you should have at each meal and snack. This information is not intended to replace advice given to you by your health care provider. Make sure you discuss any questions you have with your health care provider. Document Revised: 01/27/2017 Document Reviewed: 12/17/2015 Elsevier Patient Education  2020 [employer] Inc.  

## 2020-04-15 NOTE — Assessment & Plan Note (Signed)
A1C and urine microalbumin today Encouraged low carb diet and exercise for weight loss Continue Metformin Encouraged routine eye exam Foot exam today Flu and Covid UTD Pneumovax today

## 2020-04-15 NOTE — Progress Notes (Signed)
Subjective:    Patient ID: Martha Randall, female    DOB: 1975-09-09, 44 y.o.   MRN: [id number]  HPI  Patient presents the clinic today for 18-month follow-up of chronic conditions.  Asthma: She denies chronic cough or SOB. She is not currently using any inhalers. There are no PFT's on file.  OSA: She is not using her CPAP because she feels like this makes her sinus issues worse. There is no sleep study on file.   HTN: Her BP today is 124/84. She is taking Amlodipine as prescribed. ECG from 12/2016 reviewed.  Migraines: She reports rare headaches. Her headaches were triggered by her diet, eating meat, which she no longer eats. She has Fioricet but has not had to use this.  Depression: Deteriorated after stopping her Sertraline, she felt like she did not need it. She did just start taking the Sertraline 1 week ago. She denies anxiety, SI/HI.  DM2: Her last A1C was 7.1%, 10/2018. She is taking Metformin as prescribed. She does not check her blood sugars at home. She does not check her feet routinely but she does see a podiatrist. Her last eye exam was 09/2019. Flu-03/2020, pneumovax-never, Covid-Moderna.  HLD: Her last LDL was 68, 10/2018. She is not currently taking Atorvastatin but does plan on restarting this. She would like a refill of this today.  Insomnia: She reports she is not having too much trouble sleeping at this time. She is not taking anything for sleep. There is no sleep study on file.   Review of Systems      Past Medical History:  Diagnosis Date  . Allergy    seasonal  . Asthma    self report  . Chicken pox   . COVID-19   . Depression   . Diabetes mellitus without complication ([place])   . Frequent headaches    otc med prn  . History of blood transfusion 07/2009   [place] - 2 units transfused  . Hypertension   . Sleep apnea    uses cpap nightly    Current Outpatient Medications  Medication Sig Dispense Refill  . amLODipine (NORVASC) 10 MG tablet TAKE 1  TABLET BY MOUTH EVERY DAY 30 tablet 4  . atorvastatin (LIPITOR) 10 MG tablet Take 0.5 tablets (5 mg total) by mouth daily. 45 tablet 0  . Butalbital-APAP-Caffeine 50-300-40 MG CAPS Take 1 tablet by mouth daily as needed. (Patient not taking: Reported on 03/20/2020) 30 capsule 0  . etonogestrel (NEXPLANON) 68 MG IMPL implant 1 each by Subdermal route once. Inserted 10/08/2014    . fluticasone (FLONASE) 50 MCG/ACT nasal spray Place 1 spray into both nostrils daily as needed for allergies.   0  . hydrOXYzine (ATARAX/VISTARIL) 10 MG tablet TAKE 1 TABLET (10 MG TOTAL) BY MOUTH EVERY 12 (TWELVE) HOURS AS NEEDED. 180 tablet 0  . metFORMIN (GLUCOPHAGE-XR) 500 MG 24 hr tablet Take 1 tablet by mouth daily after supper.    Marland Kitchen oxymetazoline (AFRIN) 0.05 % nasal spray Place 2 sprays into both nostrils 2 (two) times daily as needed for congestion.    . sertraline (ZOLOFT) 25 MG tablet Take 1 tablet (25 mg total) by mouth daily. (Patient not taking: Reported on 03/20/2020) 30 tablet 2  . Vitamin D, Ergocalciferol, (DRISDOL) 1.25 MG (50000 UNIT) CAPS capsule Take 50,000 Units by mouth once a week.     No current facility-administered medications for this visit.    Allergies  Allergen Reactions  . Latex Hives    Vaginal  reaction only  . Prednisone Swelling, Rash and Other (See Comments)    Elevated blood pressure    Family History  Problem Relation Age of Onset  . Hypertension Mother   . Arthritis Maternal Aunt   . Arthritis Maternal Grandmother   . Cancer Maternal Grandmother        Breast  . Hypertension Maternal Grandmother   . Colon cancer Neg Hx   . Colon polyps Neg Hx   . Esophageal cancer Neg Hx   . Rectal cancer Neg Hx   . Stomach cancer Neg Hx     Social History   Socioeconomic History  . Marital status: Married    Spouse name: Not on file  . Number of children: Not on file  . Years of education: Not on file  . Highest education level: Not on file  Occupational History  . 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 Smoking status: Never Smoker  . Smokeless tobacco: Never Used  Vaping Use  . Vaping Use: Never used  Substance and Sexual Activity  . Alcohol use: Yes    Alcohol/week: 1.0 standard drink    Types: 1 Glasses of wine per week    Comment: occasional wine  . Drug use: No  . Sexual activity: Yes    Birth control/protection: Implant    Comment: nexplanon  Other Topics Concern  . Not on file  Social History Narrative  . Not on file   Social Determinants of Health   Financial Resource Strain:   . Difficulty of Paying Living Expenses: Not on file  Food Insecurity:   . Worried About Charity fundraiser in the Last Year: Not on file  . Ran Out of Food in the Last Year: Not on file  Transportation Needs:   . Lack of Transportation (Medical): Not on file  . Lack of Transportation (Non-Medical): Not on file  Physical Activity:   . Days of Exercise per Week: Not on file  . Minutes of Exercise per Session: Not on file  Stress:   . Feeling of Stress : Not on file  Social Connections:   . Frequency of Communication with Friends and Family: Not on file  . Frequency of Social Gatherings with Friends and Family: Not on file  . Attends Religious Services: Not on file  . Active Member of Clubs or Organizations: Not on file  . Attends Archivist Meetings: Not on file  . Marital Status: Not on file  Intimate Partner Violence:   . Fear of Current or Ex-Partner: Not on file  . Emotionally Abused: Not on file  . Physically Abused: Not on file  . Sexually Abused: Not on file     Constitutional: Pt reports rare headaches. Denies fever, malaise, fatigue, or abrupt weight changes.  HEENT: Denies eye pain, eye redness, ear pain, ringing in the ears, wax buildup, runny nose, nasal congestion, bloody nose, or sore throat. Respiratory: Denies difficulty breathing, shortness of breath, cough or sputum production.   Cardiovascular: Denies  chest pain, chest tightness, palpitations or swelling in the hands or feet.  Gastrointestinal: Denies abdominal pain, bloating, constipation, diarrhea or blood in the stool.  GU: Denies urgency, frequency, pain with urination, burning sensation, blood in urine, odor or discharge. Musculoskeletal: Denies decrease in range of motion, difficulty with gait, muscle pain or joint pain and swelling.  Skin: Denies redness, rashes, lesions or ulcercations.  Neurological: Pt reports insomnia. Denies dizziness, difficulty with memory,  difficulty with speech or problems with balance and coordination.  Psych: Pt has a history of depression. Denies anxiety, SI/HI.  No other specific complaints in a complete review of systems (except as listed in HPI above).  Objective:   Physical Exam BP 124/84   Pulse 83   Temp (!) 97.4 F (36.3 C) (Temporal)   Wt 199 lb (90.3 kg)   SpO2 98%   BMI 34.70 kg/m   Wt Readings from Last 3 Encounters:  03/20/20 201 lb (91.2 kg)  11/13/19 198 lb (89.8 kg)  10/30/19 198 lb 9.6 oz (90.1 kg)    General: Appears her stated age, obese, in NAD. Skin: Warm, dry and intact. No ulcerations noted. Cardiovascular: Normal rate and rhythm. S1,S2 noted.  No murmur, rubs or gallops noted. No JVD or BLE edema. No carotid bruits noted. Pedal pulses are +2 bilaterally.  Pulmonary/Chest: Normal effort and positive vesicular breath sounds. No respiratory distress. No wheezes, rales or ronchi noted.  Neurological: Alert and oriented. Sensation intact to BLE. Psychiatric: Mood and affect mildly flat. Behavior is normal. Judgment and thought content normal.     BMET    Component Value Date/Time   NA 138 03/20/2020 1208   K 3.6 03/20/2020 1208   CL 102 03/20/2020 1208   CO2 30 03/20/2020 1208   GLUCOSE 103 (H) 03/20/2020 1208   BUN 10 03/20/2020 1208   CREATININE 0.78 03/20/2020 1208   CREATININE 0.93 01/17/2015 1524   CALCIUM 9.1 03/20/2020 1208   GFRNONAA >60 10/06/2019 1150     GFRNONAA >60 02/14/2014 1205   GFRAA >60 10/06/2019 1150   GFRAA >60 02/14/2014 1205    Lipid Panel     Component Value Date/Time   CHOL 127 10/31/2018 0831   TRIG 56.0 10/31/2018 0831   HDL 48.40 10/31/2018 0831   CHOLHDL 3 10/31/2018 0831   VLDL 11.2 10/31/2018 0831   LDLCALC 68 10/31/2018 0831    CBC    Component Value Date/Time   WBC 8.4 03/20/2020 1208   RBC 4.85 03/20/2020 1208   HGB 13.5 03/20/2020 1208   HCT 40.4 03/20/2020 1208   PLT 432.0 (H) 03/20/2020 1208   MCV 83.3 03/20/2020 1208   MCH 27.6 10/06/2019 1150   MCHC 33.5 03/20/2020 1208   RDW 13.1 03/20/2020 1208   LYMPHSABS 3,815 10/29/2016 1639   MONOABS 545 10/29/2016 1639   EOSABS 109 10/29/2016 1639   BASOSABS 0 10/29/2016 1639    Hgb A1C Lab Results  Component Value Date   HGBA1C 7.1 (H) 08/01/2018            Assessment & Plan:    Webb Silversmith, NP This visit occurred during the SARS-CoV-2 public health emergency.  Safety protocols were in place, including screening questions prior to the visit, additional usage of staff PPE, and extensive cleaning of exam room while observing appropriate contact time as indicated for disinfecting solutions.

## 2020-04-15 NOTE — Assessment & Plan Note (Signed)
Currently not an issue She will keep Fioricet on hand in case she needs it Will monitor

## 2020-04-15 NOTE — Assessment & Plan Note (Signed)
Currently not an issue Will monitor 

## 2020-04-15 NOTE — Assessment & Plan Note (Signed)
CMET and Lipid profile today Encouraged low fat diet Will restart Atorvastatin.

## 2020-04-15 NOTE — Assessment & Plan Note (Signed)
Noncompliant with CPAP Encouraged weight loss

## 2020-06-16 ENCOUNTER — Ambulatory Visit: Payer: 59 | Attending: Internal Medicine

## 2020-06-16 ENCOUNTER — Other Ambulatory Visit: Payer: Self-pay

## 2020-06-16 DIAGNOSIS — Z23 Encounter for immunization: Secondary | ICD-10-CM

## 2020-06-16 NOTE — Progress Notes (Signed)
   Covid-19 Vaccination Clinic  Name:  Martha Randall    MRN: [id number] DOB: [birth year]  06/16/2020  Martha Randall was observed post Covid-19 immunization for 15 minutes without incident. She was provided with Vaccine Information Sheet and instruction to access the V-Safe system.   Martha Randall was instructed to call 911 with any severe reactions post vaccine: Marland Kitchen Difficulty breathing  . Swelling of face and throat  . A fast heartbeat  . A bad rash all over body  . Dizziness and weakness   Immunizations Administered    No immunizations on file.

## 2020-07-24 ENCOUNTER — Encounter: Payer: Self-pay | Admitting: Internal Medicine

## 2020-07-24 ENCOUNTER — Ambulatory Visit: Payer: 59 | Admitting: Internal Medicine

## 2020-07-24 ENCOUNTER — Other Ambulatory Visit: Payer: Self-pay

## 2020-07-24 VITALS — BP 134/90 | HR 101 | Temp 97.8°F | Wt 202.0 lb

## 2020-07-24 DIAGNOSIS — E7800 Pure hypercholesterolemia, unspecified: Secondary | ICD-10-CM

## 2020-07-24 DIAGNOSIS — R002 Palpitations: Secondary | ICD-10-CM | POA: Diagnosis not present

## 2020-07-24 DIAGNOSIS — R0789 Other chest pain: Secondary | ICD-10-CM

## 2020-07-24 DIAGNOSIS — I10 Essential (primary) hypertension: Secondary | ICD-10-CM

## 2020-07-24 DIAGNOSIS — F32A Depression, unspecified: Secondary | ICD-10-CM

## 2020-07-24 DIAGNOSIS — R0602 Shortness of breath: Secondary | ICD-10-CM

## 2020-07-24 DIAGNOSIS — F419 Anxiety disorder, unspecified: Secondary | ICD-10-CM

## 2020-07-24 DIAGNOSIS — R5383 Other fatigue: Secondary | ICD-10-CM | POA: Diagnosis not present

## 2020-07-24 LAB — CBC
HCT: 40.8 % (ref 36.0–46.0)
Hemoglobin: 13.8 g/dL (ref 12.0–15.0)
MCHC: 33.8 g/dL (ref 30.0–36.0)
MCV: 80.9 fl (ref 78.0–100.0)
Platelets: 441 10*3/uL — ABNORMAL HIGH (ref 150.0–400.0)
RBC: 5.04 Mil/uL (ref 3.87–5.11)
RDW: 13.2 % (ref 11.5–15.5)
WBC: 8.7 10*3/uL (ref 4.0–10.5)

## 2020-07-24 LAB — COMPREHENSIVE METABOLIC PANEL
ALT: 13 U/L (ref 0–35)
AST: 13 U/L (ref 0–37)
Albumin: 4.1 g/dL (ref 3.5–5.2)
Alkaline Phosphatase: 120 U/L — ABNORMAL HIGH (ref 39–117)
BUN: 11 mg/dL (ref 6–23)
CO2: 30 mEq/L (ref 19–32)
Calcium: 9.5 mg/dL (ref 8.4–10.5)
Chloride: 103 mEq/L (ref 96–112)
Creatinine, Ser: 0.8 mg/dL (ref 0.40–1.20)
GFR: 88.82 mL/min (ref >60.00)
Glucose, Bld: 109 mg/dL — ABNORMAL HIGH (ref 70–99)
Potassium: 4 mEq/L (ref 3.5–5.1)
Sodium: 139 mEq/L (ref 135–145)
Total Bilirubin: 0.7 mg/dL (ref 0.2–1.2)
Total Protein: 7.2 g/dL (ref 6.0–8.3)

## 2020-07-24 LAB — VITAMIN D 25 HYDROXY (VIT D DEFICIENCY, FRACTURES): VITD: 24.59 ng/mL — ABNORMAL LOW (ref 30.00–100.00)

## 2020-07-24 LAB — TSH: TSH: 1.25 u[IU]/mL (ref 0.35–4.50)

## 2020-07-24 LAB — VITAMIN B12: Vitamin B-12: 449 pg/mL (ref 211–911)

## 2020-07-24 MED ORDER — SERTRALINE HCL 25 MG PO TABS: 25.0000 mg | ORAL_TABLET | Freq: Every day | ORAL | 1 refills | Status: DC

## 2020-07-24 NOTE — Progress Notes (Signed)
Subjective:    Patient ID: Martha Randall, female    DOB: 04/21/1976, 45 y.o.   MRN: [id number]  HPI  Patient presents to the clinic today with complaint of increased heart rate. She noticed this 1 week ago. She reports it has been as high as 123. She reports some intermittent chest heaviness with some shortness of breath but denies chest pain or chest tightness. Her symptoms are worse at night or when she tries to relax. She denies dizziness, visual changes, nausea, back pain or diaphoresis. She does have a history of anxiety, not sure if this is what this is. She is not taking Sertraline or Hydroxyzine at this time. She is taking Amlodipine and Lipitor for management of HTN and HLD.    Review of Systems      Past Medical History:  Diagnosis Date  . Allergy    seasonal  . Asthma    self report  . Chicken pox   . COVID-19   . Depression   . Diabetes mellitus without complication ([place])   . Frequent headaches    otc med prn  . History of blood transfusion 07/2009   [place] - 2 units transfused  . Hypertension   . Sleep apnea    uses cpap nightly    Current Outpatient Medications  Medication Sig Dispense Refill  . amLODipine (NORVASC) 10 MG tablet TAKE 1 TABLET BY MOUTH EVERY DAY 30 tablet 4  . atorvastatin (LIPITOR) 10 MG tablet Take 0.5 tablets (5 mg total) by mouth daily. 45 tablet 3  . Butalbital-APAP-Caffeine 50-300-40 MG CAPS Take 1 tablet by mouth daily as needed. 30 capsule 0  . Cholecalciferol (VITAMIN D3) 125 MCG (5000 UT) CAPS Take by mouth.    . etonogestrel (NEXPLANON) 68 MG IMPL implant 1 each by Subdermal route once. Inserted 10/08/2014    . fluticasone (FLONASE) 50 MCG/ACT nasal spray Place 1 spray into both nostrils daily as needed for allergies.   0  . hydrOXYzine (ATARAX/VISTARIL) 10 MG tablet TAKE 1 TABLET (10 MG TOTAL) BY MOUTH EVERY 12 (TWELVE) HOURS AS NEEDED. 180 tablet 0  . metFORMIN (GLUCOPHAGE-XR) 500 MG 24 hr tablet Take 1 tablet by mouth daily  after supper.    Marland Kitchen oxymetazoline (AFRIN) 0.05 % nasal spray Place 2 sprays into both nostrils 2 (two) times daily as needed for congestion.    . sertraline (ZOLOFT) 25 MG tablet Take 1 tablet (25 mg total) by mouth daily. 30 tablet 2   No current facility-administered medications for this visit.    Allergies  Allergen Reactions  . Latex Hives    Vaginal reaction only  . Prednisone Swelling, Rash and Other (See Comments)    Elevated blood pressure    Family History  Problem Relation Age of Onset  . Hypertension Mother   . Arthritis Maternal Aunt   . Arthritis Maternal Grandmother   . Cancer Maternal Grandmother        Breast  . Hypertension Maternal Grandmother   . Colon cancer Neg Hx   . Colon polyps Neg Hx   . Esophageal cancer Neg Hx   . Rectal cancer Neg Hx   . Stomach cancer Neg Hx     Social History   Socioeconomic History  . Marital status: Married    Spouse name: Not on file  . Number of children: Not on file  . Years of education: Not on file  . Highest education level: Not on file  Occupational History  .  Occupation: works for Engineer, materials in Education officer, environmental  Tobacco Use  . Smoking status: Never Smoker  . Smokeless tobacco: Never Used  Vaping Use  . Vaping Use: Never used  Substance and Sexual Activity  . Alcohol use: Yes    Alcohol/week: 1.0 standard drink    Types: 1 Glasses of wine per week    Comment: occasional wine  . Drug use: No  . Sexual activity: Yes    Birth control/protection: Implant    Comment: nexplanon  Other Topics Concern  . Not on file  Social History Narrative  . Not on file   Social Determinants of Health   Financial Resource Strain: Not on file  Food Insecurity: Not on file  Transportation Needs: Not on file  Physical Activity: Not on file  Stress: Not on file  Social Connections: Not on file  Intimate Partner Violence: Not on file     Constitutional: Pt reports fatigue. Denies fever, malaise,  headache or abrupt weight  changes.  Respiratory: Pt reports shortness of breath. Denies difficulty breathing, cough or sputum production.   Cardiovascular: Patient reports palpitations, chest heaviness.  Denies chest pain, chest tightness, or swelling in the hands or feet.  Gastrointestinal: Denies abdominal pain, bloating, constipation, diarrhea or blood in the stool.  Neurological: Denies dizziness, difficulty with memory, difficulty with speech or problems with balance and coordination.  Psych: Pt has a history of anxiety. Denies depression, SI/HI.  No other specific complaints in a complete review of systems (except as listed in HPI above).  Objective:   Physical Exam   BP 134/90   Pulse (!) 101   Temp 97.8 F (36.6 C) (Temporal)   Wt 202 lb (91.6 kg)   SpO2 99%   BMI 35.22 kg/m    Wt Readings from Last 3 Encounters:  04/15/20 199 lb (90.3 kg)  03/20/20 201 lb (91.2 kg)  11/13/19 198 lb (89.8 kg)    General: Appears her stated age, obese, in NAD. Neck:  Neck supple, trachea midline. No masses, lumps or thyromegaly present.  Cardiovascular: Tachycardic with rhythm. S1,S2 noted.  No murmur, rubs or gallops noted. No JVD or BLE edema. No carotid bruits noted. Pulmonary/Chest: Normal effort and positive vesicular breath sounds. No respiratory distress. No wheezes, rales or ronchi noted.  Neurological: Alert and oriented.  Psychiatric: Mildly anxious appearing.  BMET    Component Value Date/Time   NA 137 04/15/2020 0945   K 3.5 04/15/2020 0945   CL 102 04/15/2020 0945   CO2 31 04/15/2020 0945   GLUCOSE 111 (H) 04/15/2020 0945   BUN 7 04/15/2020 0945   CREATININE 0.81 04/15/2020 0945   CREATININE 0.93 01/17/2015 1524   CALCIUM 8.8 04/15/2020 0945   GFRNONAA >60 10/06/2019 1150   GFRNONAA >60 02/14/2014 1205   GFRAA >60 10/06/2019 1150   GFRAA >60 02/14/2014 1205    Lipid Panel     Component Value Date/Time   CHOL 174 04/15/2020 0945   TRIG 63.0 04/15/2020 0945   HDL 57.70 04/15/2020  0945   CHOLHDL 3 04/15/2020 0945   VLDL 12.6 04/15/2020 0945   LDLCALC 104 (H) 04/15/2020 0945    CBC    Component Value Date/Time   WBC 7.1 04/15/2020 0945   RBC 4.97 04/15/2020 0945   HGB 13.7 04/15/2020 0945   HCT 41.3 04/15/2020 0945   PLT 410.0 (H) 04/15/2020 0945   MCV 83.0 04/15/2020 0945   MCH 27.6 10/06/2019 1150   MCHC 33.2 04/15/2020 0945  RDW 13.2 04/15/2020 0945   LYMPHSABS 3,815 10/29/2016 1639   MONOABS 545 10/29/2016 1639   EOSABS 109 10/29/2016 1639   BASOSABS 0 10/29/2016 1639    Hgb A1C Lab Results  Component Value Date   HGBA1C 7.0 (H) 04/15/2020           Assessment & Plan:  Palpitation's, Chest Heaviness, Shortness of Breath:  Indication for ECG: chest heaviness Interpretation of ECG: normal rate and rhythm, nonspecific t wave abnormality Comparison of ECG: 12/2016, no change Will check CBC, CMET, and TSH today If labs normal, mostly likely anxiety- restart Sertraline, RX sent to pharmacy  Fatigue:  Will check TSH, Vit D and B12  Will follow up after labs, return precautions discussed Nicki Reaper, NP This visit occurred during the SARS-CoV-2 public health emergency.  Safety protocols were in place, including screening questions prior to the visit, additional usage of staff PPE, and extensive cleaning of exam room while observing appropriate contact time as indicated for disinfecting solutions.

## 2020-07-24 NOTE — Patient Instructions (Signed)

## 2020-08-06 ENCOUNTER — Encounter: Payer: Self-pay | Admitting: Internal Medicine

## 2020-09-13 ENCOUNTER — Other Ambulatory Visit: Payer: Self-pay | Admitting: Internal Medicine

## 2020-10-14 ENCOUNTER — Ambulatory Visit (INDEPENDENT_AMBULATORY_CARE_PROVIDER_SITE_OTHER): Payer: 59 | Admitting: Internal Medicine

## 2020-10-14 ENCOUNTER — Encounter: Payer: Self-pay | Admitting: Internal Medicine

## 2020-10-14 ENCOUNTER — Other Ambulatory Visit: Payer: Self-pay

## 2020-10-14 VITALS — BP 132/84 | HR 74 | Temp 98.0°F | Ht 63.0 in | Wt 199.0 lb

## 2020-10-14 DIAGNOSIS — F5104 Psychophysiologic insomnia: Secondary | ICD-10-CM

## 2020-10-14 DIAGNOSIS — G4733 Obstructive sleep apnea (adult) (pediatric): Secondary | ICD-10-CM

## 2020-10-14 DIAGNOSIS — M791 Myalgia, unspecified site: Secondary | ICD-10-CM

## 2020-10-14 DIAGNOSIS — I10 Essential (primary) hypertension: Secondary | ICD-10-CM

## 2020-10-14 DIAGNOSIS — E7800 Pure hypercholesterolemia, unspecified: Secondary | ICD-10-CM | POA: Diagnosis not present

## 2020-10-14 DIAGNOSIS — J4520 Mild intermittent asthma, uncomplicated: Secondary | ICD-10-CM

## 2020-10-14 DIAGNOSIS — F339 Major depressive disorder, recurrent, unspecified: Secondary | ICD-10-CM

## 2020-10-14 DIAGNOSIS — Z0000 Encounter for general adult medical examination without abnormal findings: Secondary | ICD-10-CM

## 2020-10-14 DIAGNOSIS — E119 Type 2 diabetes mellitus without complications: Secondary | ICD-10-CM | POA: Diagnosis not present

## 2020-10-14 DIAGNOSIS — R519 Headache, unspecified: Secondary | ICD-10-CM

## 2020-10-14 DIAGNOSIS — R1031 Right lower quadrant pain: Secondary | ICD-10-CM

## 2020-10-14 DIAGNOSIS — Z0001 Encounter for general adult medical examination with abnormal findings: Secondary | ICD-10-CM

## 2020-10-14 LAB — LIPID PANEL
Cholesterol: 141 mg/dL (ref 0–200)
HDL: 58.5 mg/dL (ref >39.00)
LDL Cholesterol: 73 mg/dL (ref 0–99)
NonHDL: 82.67
Total CHOL/HDL Ratio: 2
Triglycerides: 48 mg/dL (ref 0.0–149.0)
VLDL: 9.6 mg/dL (ref 0.0–40.0)

## 2020-10-14 LAB — COMPREHENSIVE METABOLIC PANEL
ALT: 14 U/L (ref 0–35)
AST: 15 U/L (ref 0–37)
Albumin: 3.8 g/dL (ref 3.5–5.2)
Alkaline Phosphatase: 110 U/L (ref 39–117)
BUN: 7 mg/dL (ref 6–23)
CO2: 30 mEq/L (ref 19–32)
Calcium: 9 mg/dL (ref 8.4–10.5)
Chloride: 103 mEq/L (ref 96–112)
Creatinine, Ser: 0.73 mg/dL (ref 0.40–1.20)
GFR: 98.98 mL/min (ref >60.00)
Glucose, Bld: 125 mg/dL — ABNORMAL HIGH (ref 70–99)
Potassium: 3.4 mEq/L — ABNORMAL LOW (ref 3.5–5.1)
Sodium: 139 mEq/L (ref 135–145)
Total Bilirubin: 0.7 mg/dL (ref 0.2–1.2)
Total Protein: 7.1 g/dL (ref 6.0–8.3)

## 2020-10-14 LAB — HEMOGLOBIN A1C: Hgb A1c MFr Bld: 7.3 % — ABNORMAL HIGH (ref 4.6–6.5)

## 2020-10-14 NOTE — Assessment & Plan Note (Signed)
Currently not an issue off meds We will monitor 

## 2020-10-14 NOTE — Assessment & Plan Note (Signed)
C-Met, lipid and A1c today We will obtain urine microalbumin at next visit Continue Metformin Encouraged her to consume a low carb diet and exercise weight loss Encourage routine eye exams She will continue to follow with her podiatrist Flu, Pneumovax and COVID UTD

## 2020-10-14 NOTE — Assessment & Plan Note (Signed)
C-Met and lipid profile today Encouraged her to consume a low-fat diet Continue Atorvastatin

## 2020-10-14 NOTE — Progress Notes (Signed)
Subjective:    Patient ID: Martha Randall, female    DOB: 1975/12/22, 45 y.o.   MRN: [id number]  HPI  Patient presents the clinic today for her annual exam.  She is also due to follow-up chronic conditions.  Asthma: She denies chronic cough or shortness of breath.  She is not currently using any inhaler.  There are no PFTs on file.  OSA: She is not currently using her CPAP.  There are no sleep study on file.  HTN: Her BP today is 132/84.  She is taking Amlodipine as prescribed.  ECG from 07/2020 reviewed.  Migraines: Rare.  She has Fioricet to take if needed but has not used this recently.  Depression: Persistent, managed on Sertraline.  She does report some increased stress at work.  She is not currently seeing a therapist.  She denies anxiety, SI/HI.  DM2: Her last A1c was 7%, 03/2020.  She is taking Metformin as prescribed.  She does not check her blood sugars.  She sees a podiatrist routinely.  Her last eye exam was 09/2020.    HLD: Her last LDL was 104.  She denies myalgias on Atorvastatin.  She does not consume a low-fat diet.  Insomnia: Currently not an issue.  She is not taking any medications for sleep at this time.  There is no sleep study on file.  She also reports some muscle tension in her shoulders. She thinks this may be stress related to work. She sleeps with a heating pad which seems to help.   She also reports intermittent RLQ pain. She reports this typically occurs in the evening. It is worse with sitting and with laying on her right side. She denies rash in the area, nausea, vomiting, reflux, constipation, diarrhea or blood in her stool. She denies urinary or vaginal complaints. She reports the heating pad helps. She has an appt next week with GYN for further evaluation.  Flu: 03/2020 Tetanus: 07/2018 Pneumovax: 03/2020 Covid: Moderna x 3 Pap smear: 09/2019, [facility] Mammogram: 07/2020 Colon screening: 10/2019 Vision screen: annually Dentist:  biannually  Diet: She eats seafood. She consumes fruits and veggies. She tries to avoid fried foods. She drinks mostly flavored water, carbonated water Exercise: Walking, tension bands  Review of Systems  Past Medical History:  Diagnosis Date  . Allergy    seasonal  . Asthma    self report  . Chicken pox   . COVID-19   . Depression   . Diabetes mellitus without complication ([place])   . Frequent headaches    otc med prn  . History of blood transfusion 07/2009   [place] - 2 units transfused  . Hypertension   . Sleep apnea    uses cpap nightly    Current Outpatient Medications  Medication Sig Dispense Refill  . amLODipine (NORVASC) 10 MG tablet TAKE 1 TABLET BY MOUTH EVERY DAY 90 tablet 1  . atorvastatin (LIPITOR) 10 MG tablet Take 0.5 tablets (5 mg total) by mouth daily. 45 tablet 3  . Butalbital-APAP-Caffeine 50-300-40 MG CAPS Take 1 tablet by mouth daily as needed. 30 capsule 0  . Cholecalciferol (VITAMIN D3) 125 MCG (5000 UT) CAPS Take by mouth.    . etonogestrel (NEXPLANON) 68 MG IMPL implant 1 each by Subdermal route once. Inserted 10/08/2014    . fluticasone (FLONASE) 50 MCG/ACT nasal spray Place 1 spray into both nostrils daily as needed for allergies.   0  . metFORMIN (GLUCOPHAGE-XR) 500 MG 24 hr tablet Take 1 tablet  by mouth daily after supper.    Marland Kitchen oxymetazoline (AFRIN) 0.05 % nasal spray Place 2 sprays into both nostrils 2 (two) times daily as needed for congestion.    . sertraline (ZOLOFT) 25 MG tablet Take 1 tablet (25 mg total) by mouth daily. 90 tablet 1   No current facility-administered medications for this visit.    Allergies  Allergen Reactions  . Latex Hives    Vaginal reaction only  . Prednisone Swelling, Rash and Other (See Comments)    Elevated blood pressure    Family History  Problem Relation Age of Onset  . Hypertension Mother   . Arthritis Maternal Aunt   . Arthritis Maternal Grandmother   . Cancer Maternal Grandmother        Breast  .  Hypertension Maternal Grandmother   . Colon cancer Neg Hx   . Colon polyps Neg Hx   . Esophageal cancer Neg Hx   . Rectal cancer Neg Hx   . Stomach cancer Neg Hx     Social History   Socioeconomic History  . Marital status: Married    Spouse name: Not on file  . Number of children: Not on file  . Years of education: Not on file  . Highest education level: Not on file  Occupational History  . Occupation: works for [employer] in [place] Use  . Smoking status: Never Smoker  . Smokeless tobacco: Never Used  Vaping Use  . Vaping Use: Never used  Substance and Sexual Activity  . Alcohol use: Yes    Alcohol/week: 1.0 standard drink    Types: 1 Glasses of wine per week    Comment: occasional wine  . Drug use: No  . Sexual activity: Yes    Birth control/protection: Implant    Comment: nexplanon  Other Topics Concern  . Not on file  Social History Narrative  . Not on file   Social Determinants of Health   Financial Resource Strain: Not on file  Food Insecurity: Not on file  Transportation Needs: Not on file  Physical Activity: Not on file  Stress: Not on file  Social Connections: Not on file  Intimate Partner Violence: Not on file     Constitutional: Denies fever, malaise, fatigue, headache or abrupt weight changes.  HEENT: Denies eye pain, eye redness, ear pain, ringing in the ears, wax buildup, runny nose, nasal congestion, bloody nose, or sore throat. Respiratory: Denies difficulty breathing, shortness of breath, cough or sputum production.   Cardiovascular: Denies chest pain, chest tightness, palpitations or swelling in the hands or feet.  Gastrointestinal: Pt reports RLQ pain. Denies bloating, constipation, diarrhea or blood in the stool.  GU: Denies urgency, frequency, pain with urination, burning sensation, blood in urine, odor or discharge. Musculoskeletal: Pt reports muscle tension in shoulders. Denies decrease in range of motion, difficulty with  gait, or joint pain and swelling.  Skin: Denies redness, rashes, lesions or ulcercations.  Neurological: Denies dizziness, difficulty with memory, difficulty with speech or problems with balance and coordination.  Psych: Pt has a history of depression. Denies anxiety,  SI/HI.  No other specific complaints in a complete review of systems (except as listed in HPI above).     Objective:   Physical Exam  BP 132/84   Pulse 74   Temp 98 F (36.7 C) (Temporal)   Ht 5' 3"  (1.6 m)   Wt 199 lb (90.3 kg)   SpO2 98%   BMI 35.25 kg/m   Wt Readings  from Last 3 Encounters:  07/24/20 202 lb (91.6 kg)  04/15/20 199 lb (90.3 kg)  03/20/20 201 lb (91.2 kg)    General: Appears their stated age, obese, in NAD. Skin: Warm, dry and intact. No ulcerations noted. HEENT: Head: normal shape and size; Eyes: sclera white, no icterus, conjunctiva pink, PERRLA and EOMs intact;  Neck:  Neck supple, trachea midline. No masses, lumps or thyromegaly present.  Cardiovascular: Normal rate and rhythm. S1,S2 noted.  No murmur, rubs or gallops noted. No JVD or BLE edema. No carotid bruits noted. Pulmonary/Chest: Normal effort and positive vesicular breath sounds. No respiratory distress. No wheezes, rales or ronchi noted.  Abdomen: Soft and nontender. Normal bowel sounds. No distention or masses noted. Liver, spleen and kidneys non palpable. Musculoskeletal: Strength 5/5 BUE/BLE. No difficulty with gait.  Neurological: Alert and oriented. Cranial nerves II-XII grossly intact. Coordination normal.  Psychiatric: Mood and affect normal. Behavior is normal. Judgment and thought content normal.     BMET    Component Value Date/Time   NA 139 07/24/2020 1226   K 4.0 07/24/2020 1226   CL 103 07/24/2020 1226   CO2 30 07/24/2020 1226   GLUCOSE 109 (H) 07/24/2020 1226   BUN 11 07/24/2020 1226   CREATININE 0.80 07/24/2020 1226   CREATININE 0.93 01/17/2015 1524   CALCIUM 9.5 07/24/2020 1226   GFRNONAA >60 10/06/2019  1150   GFRNONAA >60 02/14/2014 1205   GFRAA >60 10/06/2019 1150   GFRAA >60 02/14/2014 1205    Lipid Panel     Component Value Date/Time   CHOL 174 04/15/2020 0945   TRIG 63.0 04/15/2020 0945   HDL 57.70 04/15/2020 0945   CHOLHDL 3 04/15/2020 0945   VLDL 12.6 04/15/2020 0945   LDLCALC 104 (H) 04/15/2020 0945    CBC    Component Value Date/Time   WBC 8.7 07/24/2020 1226   RBC 5.04 07/24/2020 1226   HGB 13.8 07/24/2020 1226   HCT 40.8 07/24/2020 1226   PLT 441.0 (H) 07/24/2020 1226   MCV 80.9 07/24/2020 1226   MCH 27.6 10/06/2019 1150   MCHC 33.8 07/24/2020 1226   RDW 13.2 07/24/2020 1226   LYMPHSABS 3,815 10/29/2016 1639   MONOABS 545 10/29/2016 1639   EOSABS 109 10/29/2016 1639   BASOSABS 0 10/29/2016 1639    Hgb A1C Lab Results  Component Value Date   HGBA1C 7.0 (H) 04/15/2020            Assessment & Plan:   Preventative Health Maintenance:  Flu shot UTD Tetanus UTD Pneumovax UTD COVID UTD Mammogram UTD, will request copy Pap smear UTD, will request copy Colon screening UTD Encouraged her to consume a balanced diet and exercise regimen Advised her to see an eye doctor and dentist annually We will check c-Met, lipid and A1c today  RLQ Pain:   Exam benign We will have her follow-up with GYN  Muscle Tension in Shoulders:  Encourage heat, stretching and massage  RTC in 6 months, follow-up chronic conditions Webb Silversmith, NP This visit occurred during the SARS-CoV-2 public health emergency.  Safety protocols were in place, including screening questions prior to the visit, additional usage of staff PPE, and extensive cleaning of exam room while observing appropriate contact time as indicated for disinfecting solutions.

## 2020-10-14 NOTE — Assessment & Plan Note (Signed)
Persistent stress at work, she is looking for a new job Continue Sertraline Support offered We will monitor

## 2020-10-14 NOTE — Assessment & Plan Note (Signed)
Encouraged weight loss Not using CPAP We will monitor

## 2020-10-14 NOTE — Assessment & Plan Note (Signed)
Reasonable control on Amlodipine Reinforced DASH diet and exercise for weight loss C met today

## 2020-10-14 NOTE — Patient Instructions (Signed)
Health Maintenance, Female Adopting a healthy lifestyle and getting preventive care are important in promoting health and wellness. Ask your health care provider about:  The right schedule for you to have regular tests and exams.  Things you can do on your own to prevent diseases and keep yourself healthy. What should I know about diet, weight, and exercise? Eat a healthy diet  Eat a diet that includes plenty of vegetables, fruits, low-fat dairy products, and lean protein.  Do not eat a lot of foods that are high in solid fats, added sugars, or sodium.   Maintain a healthy weight Body mass index (BMI) is used to identify weight problems. It estimates body fat based on height and weight. Your health care provider can help determine your BMI and help you achieve or maintain a healthy weight. Get regular exercise Get regular exercise. This is one of the most important things you can do for your health. Most adults should:  Exercise for at least 150 minutes each week. The exercise should increase your heart rate and make you sweat (moderate-intensity exercise).  Do strengthening exercises at least twice a week. This is in addition to the moderate-intensity exercise.  Spend less time sitting. Even light physical activity can be beneficial. Watch cholesterol and blood lipids Have your blood tested for lipids and cholesterol at 46 years of age, then have this test every 5 years. Have your cholesterol levels checked more often if:  Your lipid or cholesterol levels are high.  You are older than 46 years of age.  You are at high risk for heart disease. What should I know about cancer screening? Depending on your health history and family history, you may need to have cancer screening at various ages. This may include screening for:  Breast cancer.  Cervical cancer.  Colorectal cancer.  Skin cancer.  Lung cancer. What should I know about heart disease, diabetes, and high blood  pressure? Blood pressure and heart disease  High blood pressure causes heart disease and increases the risk of stroke. This is more likely to develop in people who have high blood pressure readings, are of African descent, or are overweight.  Have your blood pressure checked: ? Every 3-5 years if you are 18-39 years of age. ? Every year if you are 40 years old or older. Diabetes Have regular diabetes screenings. This checks your fasting blood sugar level. Have the screening done:  Once every three years after age 40 if you are at a normal weight and have a low risk for diabetes.  More often and at a younger age if you are overweight or have a high risk for diabetes. What should I know about preventing infection? Hepatitis B If you have a higher risk for hepatitis B, you should be screened for this virus. Talk with your health care provider to find out if you are at risk for hepatitis B infection. Hepatitis C Testing is recommended for:  Everyone born from 1945 through 1965.  Anyone with known risk factors for hepatitis C. Sexually transmitted infections (STIs)  Get screened for STIs, including gonorrhea and chlamydia, if: ? You are sexually active and are younger than 46 years of age. ? You are older than 46 years of age and your health care provider tells you that you are at risk for this type of infection. ? Your sexual activity has changed since you were last screened, and you are at increased risk for chlamydia or gonorrhea. Ask your health care provider   if you are at risk.  Ask your health care provider about whether you are at high risk for HIV. Your health care provider may recommend a prescription medicine to help prevent HIV infection. If you choose to take medicine to prevent HIV, you should first get tested for HIV. You should then be tested every 3 months for as long as you are taking the medicine. Pregnancy  If you are about to stop having your period (premenopausal) and  you may become pregnant, seek counseling before you get pregnant.  Take 400 to 800 micrograms (mcg) of folic acid every day if you become pregnant.  Ask for birth control (contraception) if you want to prevent pregnancy. Osteoporosis and menopause Osteoporosis is a disease in which the bones lose minerals and strength with aging. This can result in bone fractures. If you are 65 years old or older, or if you are at risk for osteoporosis and fractures, ask your health care provider if you should:  Be screened for bone loss.  Take a calcium or vitamin D supplement to lower your risk of fractures.  Be given hormone replacement therapy (HRT) to treat symptoms of menopause. Follow these instructions at home: Lifestyle  Do not use any products that contain nicotine or tobacco, such as cigarettes, e-cigarettes, and chewing tobacco. If you need help quitting, ask your health care provider.  Do not use street drugs.  Do not share needles.  Ask your health care provider for help if you need support or information about quitting drugs. Alcohol use  Do not drink alcohol if: ? Your health care provider tells you not to drink. ? You are pregnant, may be pregnant, or are planning to become pregnant.  If you drink alcohol: ? Limit how much you use to 0-1 drink a day. ? Limit intake if you are breastfeeding.  Be aware of how much alcohol is in your drink. In the [place] one drink equals one 12 oz bottle of beer (355 mL), one 5 oz glass of wine (148 mL), or one 1 oz glass of hard liquor (44 mL). General instructions  Schedule regular health, dental, and eye exams.  Stay current with your vaccines.  Tell your health care provider if: ? You often feel depressed. ? You have ever been abused or do not feel safe at home. Summary  Adopting a healthy lifestyle and getting preventive care are important in promoting health and wellness.  Follow your health care provider's instructions about healthy  diet, exercising, and getting tested or screened for diseases.  Follow your health care provider's instructions on monitoring your cholesterol and blood pressure. This information is not intended to replace advice given to you by your health care provider. Make sure you discuss any questions you have with your health care provider. Document Revised: 06/28/2018 Document Reviewed: 06/28/2018 Elsevier Patient Education  2021 Elsevier Inc.  

## 2020-11-11 ENCOUNTER — Other Ambulatory Visit: Payer: Self-pay | Admitting: Internal Medicine

## 2020-11-12 ENCOUNTER — Telehealth: Payer: Self-pay

## 2021-05-14 ENCOUNTER — Other Ambulatory Visit: Payer: Self-pay | Admitting: Internal Medicine

## 2021-05-14 NOTE — Telephone Encounter (Signed)
Note states that pt will be staying with Ms. Martha Randall, but no upcoming appointments.

## 2021-05-14 NOTE — Telephone Encounter (Signed)
Requested medication (s) are due for refill today Yes  Requested medication (s) are on the active medication list Yes  Future visit scheduled No. Called and left VM to schedule appointment with [facility] for continued refills.   Note to clinic-Per chart note on 11/11/20 patient wants to see [place] at [facility].   Most recent Lipids 10/14/20. Routing to clinic for review.   Requested Prescriptions  Pending Prescriptions Disp Refills   atorvastatin (LIPITOR) 10 MG tablet [Pharmacy Med Name: ATORVASTATIN 10 MG TABLET] 45 tablet 3    Sig: TAKE 1/2 TABLET BY MOUTH DAILY     There is no refill protocol information for this order

## 2021-07-09 ENCOUNTER — Telehealth: Payer: Self-pay | Admitting: Internal Medicine

## 2021-07-09 NOTE — Telephone Encounter (Signed)
Patient called, left VM to return the call to the office. 

## 2021-07-09 NOTE — Telephone Encounter (Signed)
Patient called, left VM to return the call to [facility] to discuss refills. The below message is unclear, is she wanting to remain with [facility] or go to another practice.

## 2021-07-09 NOTE — Telephone Encounter (Signed)
Medication Refill - Medication: Sertraline, atorvastatin, Jardiance  Has the patient contacted their pharmacy? Yes.   Pt states that she has contacted the pharmacy and that they are not able to give her these medications by her new PCP due pt being a previous pt of [facility] and her medications "not being pushed through" Please advise.  (Agent: If no, request that the patient contact the pharmacy for the refill. If patient does not wish to contact the pharmacy document the reason why and proceed with request.) (Agent: If yes, when and what did the pharmacy advise?)  Preferred Pharmacy (with phone number or street name):  [id number] [place] [address]  [address] [place] [address]  Phone: [phone] Fax: [phone]  Hours: Not open 24 hours   Has the patient been seen for an appointment in the last year OR does the patient have an upcoming appointment? Yes.    Agent: Please be advised that RX refills may take up to 3 business days. We ask that you follow-up with your pharmacy.

## 2021-07-10 NOTE — Telephone Encounter (Signed)
I called the patient and she informed me that everything is okay now. She switch providers because the [facility] location was more convenient for her. She said all her prescription from her new provider went through with no problem.

## 2021-08-22 IMAGING — CT CT RENAL STONE PROTOCOL
3 of 4 series · 9 of 46 positions shown, 16 images · non-contrast
Comparison: None.

CLINICAL DATA: Acute right flank pain.

EXAM:
CT ABDOMEN AND PELVIS WITHOUT CONTRAST
TECHNIQUE: Multidetector CT imaging of the abdomen and pelvis was performed
following the standard protocol without IV contrast.

[Series 4: lung bases · axial · 0.54mm/px · z∈[-672,-592]mm · 5 of 25 slices shown, 10 images]
[im 5/25  soft-tissue]
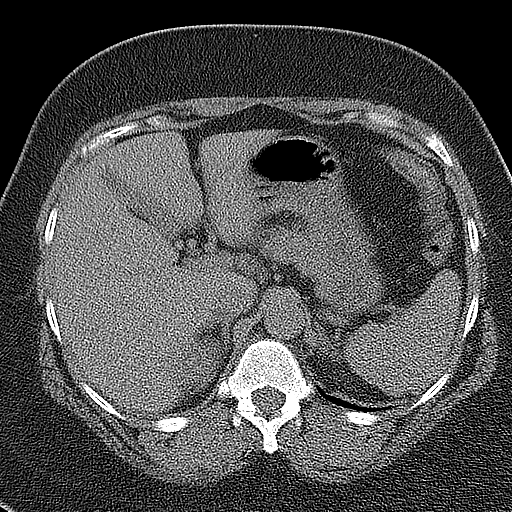
[im 5/25  bone]
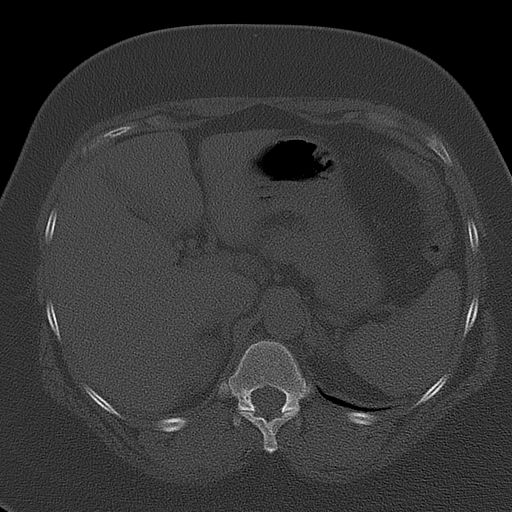
[im 9/25  soft-tissue]
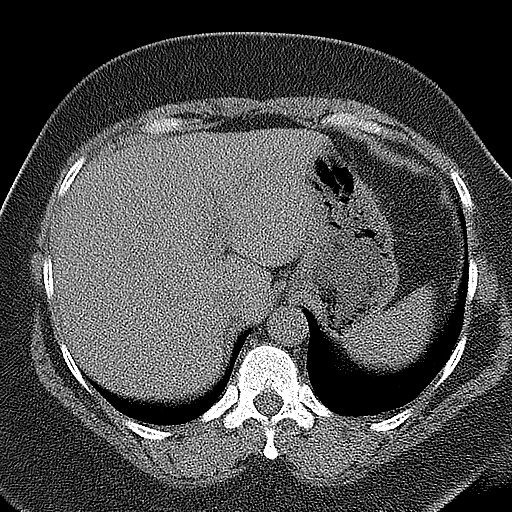
[im 9/25  lung]
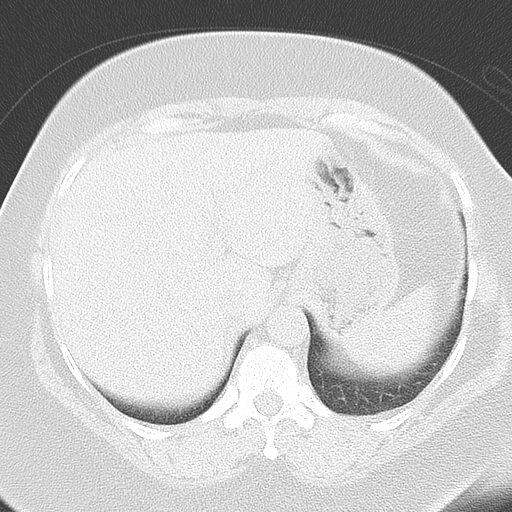
[im 13/25  soft-tissue]
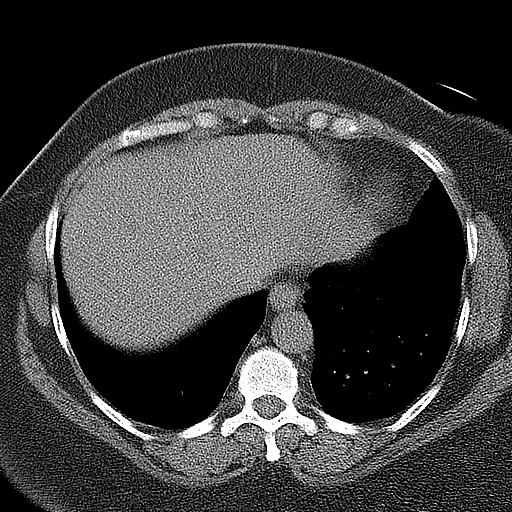
[im 13/25  lung]
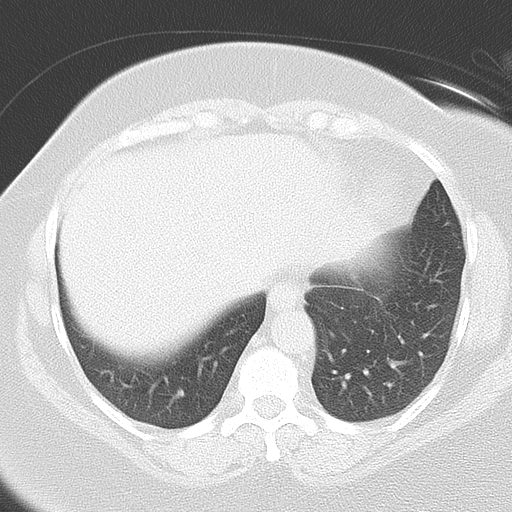
[im 17/25  soft-tissue]
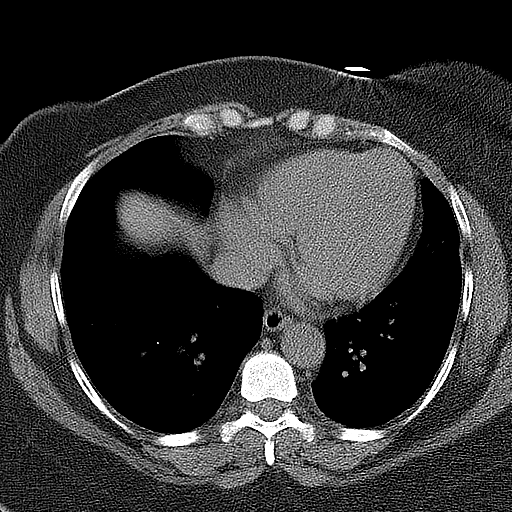
[im 17/25  lung]
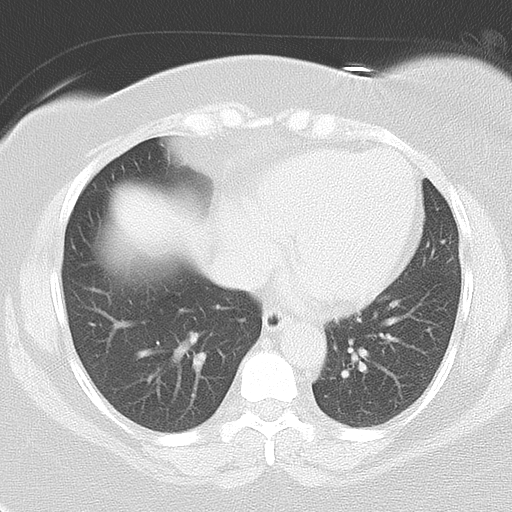
[im 21/25  soft-tissue]
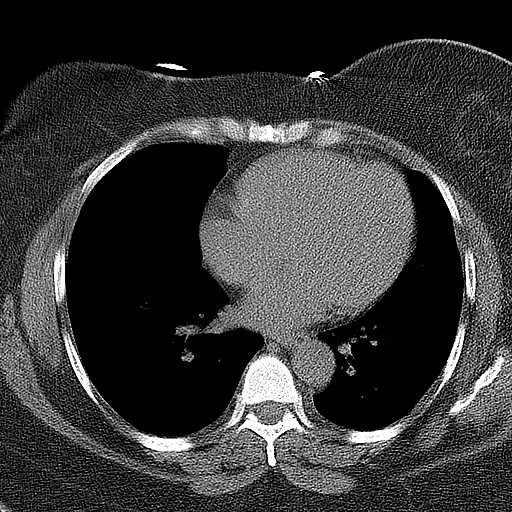
[im 21/25  lung]
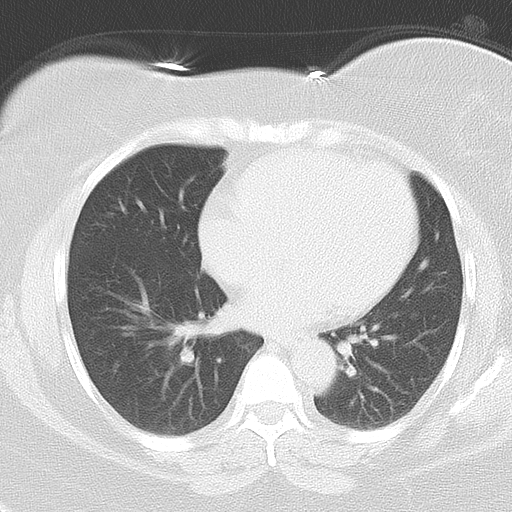

[Series 5: coronal · coronal · 0.72mm/px · 3 of 141 slices shown, 4 images]
[im 47/141  soft-tissue]
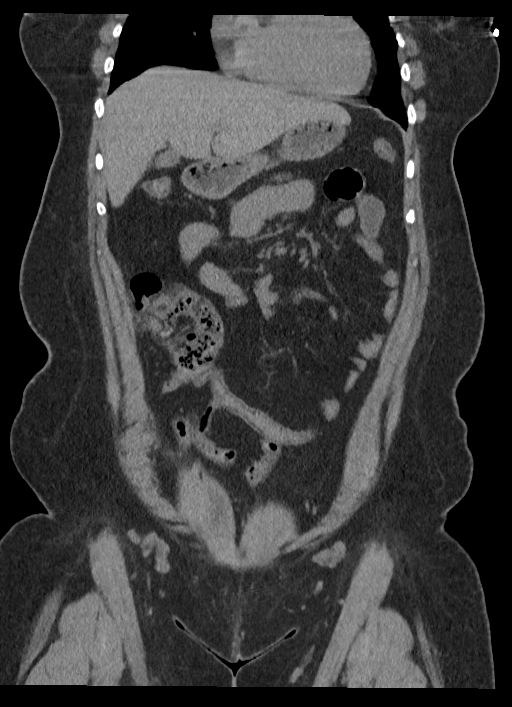
[im 63/141  soft-tissue]
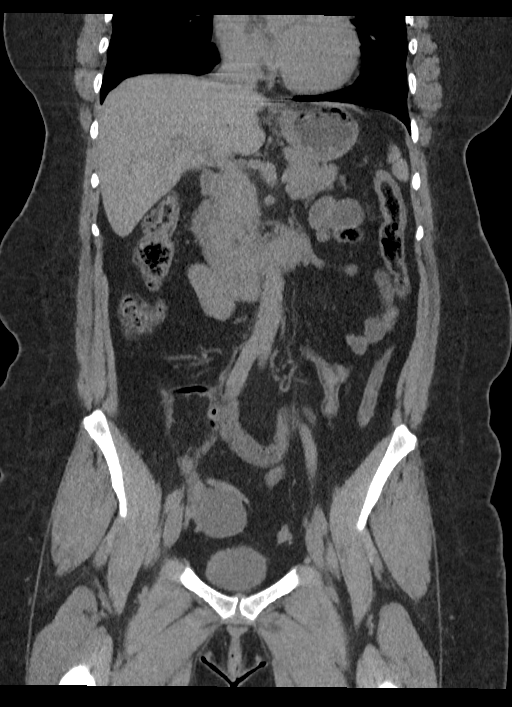
[im 63/141  bone]
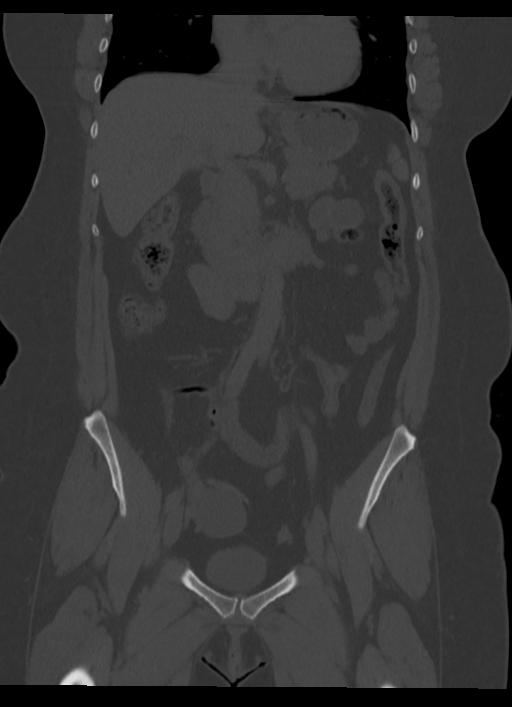
[im 78/141  soft-tissue]
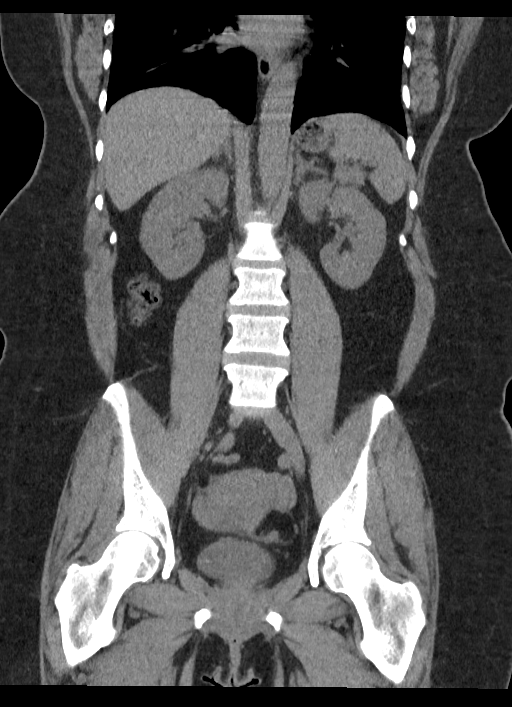

[Series 6: sagittal · sagittal · 0.60mm/px · 1 of 182 slices shown, 2 images]
[im 61/182  soft-tissue]
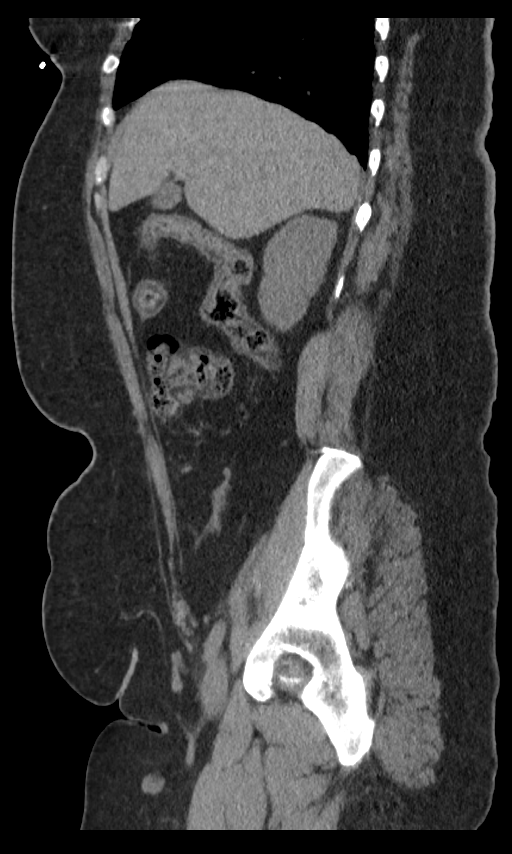
[im 61/182  bone]
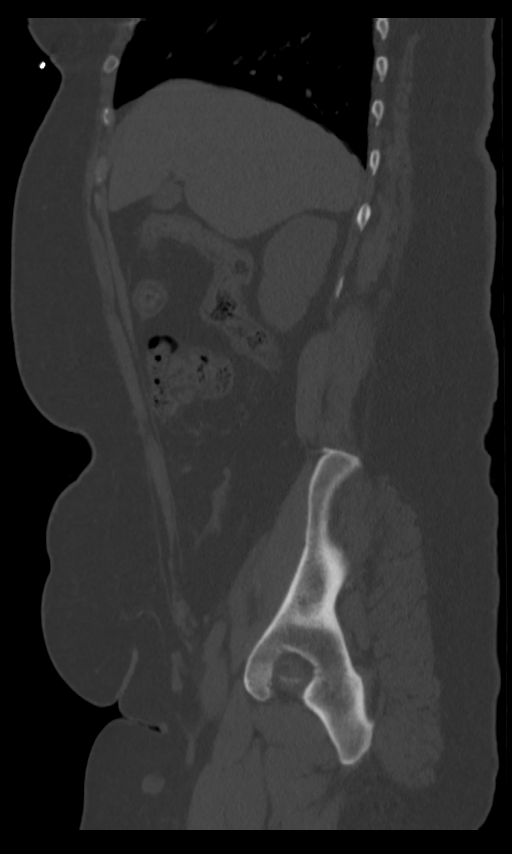

[9 of 46 positions shown; findings below may reference images not displayed]

FINDINGS: Lower chest: No acute abnormality.

Hepatobiliary: No focal liver abnormality is seen. No gallstones,
gallbladder wall thickening, or biliary dilatation.

Pancreas: Unremarkable. No pancreatic ductal dilatation or
surrounding inflammatory changes.

Spleen: Normal in size without focal abnormality.

Adrenals/Urinary Tract: Adrenal glands are unremarkable. Kidneys are
normal, without renal calculi, focal lesion, or hydronephrosis.
Bladder is unremarkable.

Stomach/Bowel: Stomach is within normal limits. Appendix appears
normal. No evidence of bowel wall thickening, distention, or
inflammatory changes.

Vascular/Lymphatic: No significant vascular findings are present. No
enlarged abdominal or pelvic lymph nodes.

Reproductive: Intrauterine device is noted. 4.3 cm right cystic
abnormality is noted.

Other: No abdominal wall hernia or abnormality. No abdominopelvic
ascites.

Musculoskeletal: No acute or significant osseous findings.
IMPRESSION: 1. 4.3 cm right ovarian cystic abnormality is noted. Pelvic
ultrasound is recommended for further evaluation.
2. No other abnormality seen in the abdomen or pelvis.

## 2021-11-13 ENCOUNTER — Ambulatory Visit: Payer: 59 | Attending: Family

## 2021-11-13 DIAGNOSIS — Z23 Encounter for immunization: Secondary | ICD-10-CM

## 2021-11-13 NOTE — Progress Notes (Signed)
? ?  Covid-19 Vaccination Clinic ? ?Name:  Martha Randall    ?MRN: [id number] ?DOB: Dec 31, 1975 ? ?11/13/2021 ? ?[name] was observed post Covid-19 immunization for 15 minutes without incident. She was provided with Vaccine Information Sheet and instruction to access the V-Safe system.  ? ?[name] was instructed to call 911 with any severe reactions post vaccine: ?Difficulty breathing  ?Swelling of face and throat  ?A fast heartbeat  ?A bad rash all over body  ?Dizziness and weakness  ? ?Immunizations Administered   ? ? Name Date Dose VIS Date Route  ? Moderna Covid-19 vaccine Bivalent Booster 11/13/2021  2:30 PM 0.5 mL 02/28/2021 Intramuscular  ? Manufacturer: Moderna  ? Lot: [id number]  ? [place]: [id number]  ? ?  ?  ?

## 2024-02-03 ENCOUNTER — Encounter: Payer: Self-pay | Admitting: Advanced Practice Midwife

## 2024-03-23 ENCOUNTER — Ambulatory Visit: Admission: EM | Admit: 2024-03-23 | Discharge: 2024-03-23 | Disposition: A | Discharge status: Home / Self Care

## 2024-03-23 DIAGNOSIS — J0110 Acute frontal sinusitis, unspecified: Secondary | ICD-10-CM

## 2024-03-23 MED ORDER — AMOXICILLIN-POT CLAVULANATE 875-125 MG PO TABS: 1.0000 | ORAL_TABLET | Freq: Two times a day (BID) | ORAL | 0 refills | Status: DC

## 2024-03-23 NOTE — ED Provider Notes (Signed)
 CAY RALPH PELT    CSN: [id number] Arrival date & time: 03/23/24  1038      History   Chief Complaint Chief Complaint  Patient presents with   Facial Pain    HPI Martha Randall is a 48 y.o. female.  Patient presents with 1 week history of sinus pressure, congestion, postnasal drip, sinus headache, mild nonproductive cough.  She has been treating her symptoms with Allegra without relief.  No fever, wheezing, shortness of breath.  The history is provided by the patient and medical records.    Past Medical History:  Diagnosis Date   Allergy    seasonal   Asthma    self report   Chicken pox    COVID-19    Depression    Diabetes mellitus without complication (HCC)    Frequent headaches    otc med prn   History of blood transfusion 07/2009   [facility] - 2 units transfused   Hypertension    Sleep apnea    uses cpap nightly    Patient Active Problem List   Diagnosis Date Noted   HLD (hyperlipidemia) 10/11/2019   Insomnia 10/11/2019   DM (diabetes mellitus), type 2 (HCC) 10/04/2016   OSA (obstructive sleep apnea) 01/17/2015   HTN (hypertension) 01/28/2014   Depression, recurrent (HCC) 12/17/2013   Frequent headaches 07/22/2010   Allergic rhinitis 07/22/2010   Asthma 07/22/2010    Past Surgical History:  Procedure Laterality Date   BREAST BIOPSY Right 2004   lump is tagged   CESAREAN SECTION     x 1   HYSTEROSCOPY N/A 10/08/2014   Procedure: HYSTEROSCOPIC IUD Removal/Nexplanon Insertion;  Surgeon: Dickie Carder, MD;  Location: [facility];  Service: Gynecology;  Laterality: N/A;  45 min.   HYSTEROSCOPY WITH D & C N/A 09/26/2014   Procedure: DILATATION AND CURETTAGE /HYSTEROSCOPY/Hysteroscopic IUD Removal, NEXPLANON INSERTION ;  Surgeon: Dickie Carder, MD;  Location: [facility];  Service: Gynecology;  Laterality: N/A;   SEPTOPLASTY     WISDOM TOOTH EXTRACTION      OB History   No obstetric history on file.      Home Medications    Prior to  Admission medications   Medication Sig Start Date End Date Taking? Authorizing Provider  amLODipine  (NORVASC ) 10 MG tablet TAKE 1 TABLET BY MOUTH EVERY DAY 09/15/20  Yes Antonette Angeline ORN, NP  amoxicillin -clavulanate (AUGMENTIN ) 875-125 MG tablet Take 1 tablet by mouth every 12 (twelve) hours. 03/23/24  Yes Corlis Burnard DEL, NP  atorvastatin  (LIPITOR) 10 MG tablet Take 0.5 tablets (5 mg total) by mouth daily. Patient taking differently: Take 10 mg by mouth daily. 04/15/20  Yes Antonette Angeline ORN, NP  etonogestrel (NEXPLANON) 68 MG IMPL implant 1 each by Subdermal route once. Inserted 10/08/2014   Yes [provider]  fexofenadine (ALLEGRA) 180 MG tablet Take 180 mg by mouth daily. 03/19/24  Yes [provider]  JARDIANCE 25 MG TABS tablet Take 25 mg by mouth daily. 01/02/24  Yes [provider]  OZEMPIC, 0.25 OR 0.5 MG/DOSE, 2 MG/3ML SOPN Inject 0.5 mg into the skin once a week. 03/17/24  Yes [provider]  sertraline  (ZOLOFT ) 25 MG tablet TAKE 1 TABLET (25 MG TOTAL) BY MOUTH DAILY. 11/12/20  Yes Baity, Angeline ORN, NP  Butalbital -APAP-Caffeine  50-300-40 MG CAPS Take 1 tablet by mouth daily as needed. 01/17/15   Antonette Angeline ORN, NP  Cholecalciferol (VITAMIN D3) 125 MCG (5000 UT) CAPS Take by mouth.    [provider]  fluticasone  (FLONASE ) 50 MCG/ACT nasal spray Place 1 spray into both nostrils daily as needed for allergies.  07/08/14   [provider]  metFORMIN (GLUCOPHAGE-XR) 500 MG 24 hr tablet Take 1 tablet by mouth daily after supper. 02/18/18   [provider]  oxymetazoline  (AFRIN) 0.05 % nasal spray Place 2 sprays into both nostrils 2 (two) times daily as needed for congestion.    [provider]    Family History Family History  Problem Relation Age of Onset   Hypertension Mother    Arthritis Maternal Aunt    Arthritis Maternal Grandmother    Cancer Maternal Grandmother        Breast   Hypertension Maternal Grandmother    Colon  cancer Neg Hx    Colon polyps Neg Hx    Esophageal cancer Neg Hx    Rectal cancer Neg Hx    Stomach cancer Neg Hx     Social History Social History   Tobacco Use   Smoking status: Never   Smokeless tobacco: Never  Vaping Use   Vaping status: Never Used  Substance Use Topics   Alcohol use: Yes    Alcohol/week: 1.0 standard drink of alcohol    Types: 1 Glasses of wine per week    Comment: occasional wine   Drug use: No     Allergies   Latex and Prednisone   Review of Systems Review of Systems  Constitutional:  Negative for chills and fever.  HENT:  Positive for congestion, postnasal drip, rhinorrhea and sinus pressure. Negative for ear pain and sore throat.   Respiratory:  Positive for cough. Negative for shortness of breath.      Physical Exam Triage Vital Signs ED Triage Vitals  Encounter Vitals Group     BP 03/23/24 1051 (!) 152/94     Girls Systolic BP Percentile --      Girls Diastolic BP Percentile --      Boys Systolic BP Percentile --      Boys Diastolic BP Percentile --      Pulse Rate 03/23/24 1051 72     Resp 03/23/24 1051 19     Temp 03/23/24 1051 98.2 F (36.8 C)     Temp Source 03/23/24 1051 Oral     SpO2 03/23/24 1051 96 %     Weight --      Height --      Head Circumference --      Peak Flow --      Pain Score 03/23/24 1050 5     Pain Loc --      Pain Education --      Exclude from Growth Chart --    No data found.  Updated Vital Signs BP (!) 152/94 (BP Location: Left Arm)   Pulse 72   Temp 98.2 F (36.8 C) (Oral)   Resp 19   SpO2 96%   Visual Acuity Right Eye Distance:   Left Eye Distance:   Bilateral Distance:    Right Eye Near:   Left Eye Near:    Bilateral Near:     Physical Exam Constitutional:      General: She is not in acute distress. HENT:     Right Ear: Tympanic membrane normal.     Left Ear: Tympanic membrane normal.     Nose: Congestion present.     Mouth/Throat:     Mouth: Mucous membranes are moist.      Pharynx: Oropharynx is clear.  Cardiovascular:  Rate and Rhythm: Normal rate and regular rhythm.     Heart sounds: Normal heart sounds.  Pulmonary:     Effort: Pulmonary effort is normal. No respiratory distress.     Breath sounds: Normal breath sounds. No wheezing.  Neurological:     Mental Status: She is alert.      UC Treatments / Results  Labs (all labs ordered are listed, but only abnormal results are displayed) Labs Reviewed - No data to display  EKG   Radiology No results found.  Procedures Procedures (including critical care time)  Medications Ordered in UC Medications - No data to display  Initial Impression / Assessment and Plan / UC Course  I have reviewed the triage vital signs and the nursing notes.  Pertinent labs & imaging results that were available during my care of the patient were reviewed by me and considered in my medical decision making (see chart for details).    Acute sinusitis.  Lungs are clear and O2 sat is 96% on room air.  Treating today with Augmentin .  Education provided on sinus infection.  Instructed patient to follow-up with her PCP if she is not improving.  She agrees to plan of care.  Final Clinical Impressions(s) / UC Diagnoses   Final diagnoses:  Acute non-recurrent frontal sinusitis     Discharge Instructions      Take the Augmentin  as directed.  Follow-up with your primary care provider if your symptoms are not improving.      ED Prescriptions     Medication Sig Dispense Auth. Provider   amoxicillin -clavulanate (AUGMENTIN ) 875-125 MG tablet Take 1 tablet by mouth every 12 (twelve) hours. 14 tablet Corlis Burnard DEL, NP      PDMP not reviewed this encounter.   Corlis Burnard DEL, NP 03/23/24 1116

## 2024-03-23 NOTE — ED Triage Notes (Signed)
 Patient states that she's had sinus pressure and nasal congestion x 1 week. Patient states that she's taken 4 covid test that have came back negative. Patient did a evisit on Monday, was given allegra. Its helping but not going away.

## 2024-03-23 NOTE — Discharge Instructions (Addendum)
 Take the Augmentin as directed.  Follow up with your primary care provider if your symptoms are not improving.

## 2024-06-23 ENCOUNTER — Inpatient Hospital Stay: Admission: RE | Admit: 2024-06-23 | Discharge: 2024-06-23 | Payer: Self-pay

## 2024-06-23 VITALS — BP 149/88 | HR 75 | Temp 97.8°F | Resp 20

## 2024-06-23 DIAGNOSIS — M26622 Arthralgia of left temporomandibular joint: Secondary | ICD-10-CM | POA: Diagnosis not present

## 2024-06-23 DIAGNOSIS — R0982 Postnasal drip: Secondary | ICD-10-CM

## 2024-06-23 MED ORDER — CETIRIZINE HCL 10 MG PO TABS: 10.0000 mg | ORAL_TABLET | Freq: Every day | ORAL | 2 refills | Status: AC

## 2024-06-23 MED ORDER — IBUPROFEN 600 MG PO TABS: 600.0000 mg | ORAL_TABLET | Freq: Four times a day (QID) | ORAL | 0 refills | Status: AC | PRN

## 2024-06-23 NOTE — ED Provider Notes (Signed)
 CAY RALPH PELT    CSN: [id number] Arrival date & time: 06/23/24  0846      History   Chief Complaint Chief Complaint  Patient presents with   Nasal Congestion    Swollen lymphnodes on left side of face. Nasal drainage. No other pain. - Entered by patient    HPI NATHALEE SMARR is a 48 y.o. female presenting with nasal congestion.  We last saw her for sinusitis on 03/2024, which was managed with Augmentin  with resolution. The patient reports a history of asthma, which is currently managed with no inhalers. Patient reports nasal congestion x 1 week. Patient reports jaw pain on the L side of the face, that exclusively happens when eating. Notes PND that is worse on the left side. Denies teeth pain. Denies facial pain. Denies thick or bloody nasal congestion. Denies fevers. Patient has been taking Alka Seltzer cold plus and nasal spray with mild relief. Last dose of Alka Seltzer was yesterday. Using humidifier at home. Using nasonex nasal spray.  Does not currently take an antihistamine.     HPI  Past Medical History:  Diagnosis Date   Allergy    seasonal   Asthma    self report   Chicken pox    COVID-19    Depression    Diabetes mellitus without complication (HCC)    Frequent headaches    otc med prn   History of blood transfusion 07/2009   [facility] - 2 units transfused   Hypertension    Sleep apnea    uses cpap nightly    Patient Active Problem List   Diagnosis Date Noted   HLD (hyperlipidemia) 10/11/2019   Insomnia 10/11/2019   DM (diabetes mellitus), type 2 (HCC) 10/04/2016   OSA (obstructive sleep apnea) 01/17/2015   HTN (hypertension) 01/28/2014   Depression, recurrent 12/17/2013   Frequent headaches 07/22/2010   Allergic rhinitis 07/22/2010   Asthma 07/22/2010    Past Surgical History:  Procedure Laterality Date   BREAST BIOPSY Right 2004   lump is tagged   CESAREAN SECTION     x 1   HYSTEROSCOPY N/A 10/08/2014   Procedure: HYSTEROSCOPIC IUD  Removal/Nexplanon Insertion;  Surgeon: Dickie Carder, MD;  Location: [facility];  Service: Gynecology;  Laterality: N/A;  45 min.   HYSTEROSCOPY WITH D & C N/A 09/26/2014   Procedure: DILATATION AND CURETTAGE /HYSTEROSCOPY/Hysteroscopic IUD Removal, NEXPLANON INSERTION ;  Surgeon: Dickie Carder, MD;  Location: [facility];  Service: Gynecology;  Laterality: N/A;   SEPTOPLASTY     WISDOM TOOTH EXTRACTION      OB History   No obstetric history on file.      Home Medications    Prior to Admission medications   Medication Sig Start Date End Date Taking? Authorizing Provider  cetirizine  (ZYRTEC ) 10 MG tablet Take 1 tablet (10 mg total) by mouth daily. 06/23/24  Yes Adrieanna Boteler E, PA-C  Empagliflozin-metFORMIN HCl ER (SYNJARDY XR) 25-1000 MG TB24 Take 1 tablet by mouth daily. 08/11/21  Yes [provider]  ibuprofen  (ADVIL ) 600 MG tablet Take 1 tablet (600 mg total) by mouth every 6 (six) hours as needed. 06/23/24  Yes Levia Waltermire E, PA-C  amLODipine  (NORVASC ) 10 MG tablet TAKE 1 TABLET BY MOUTH EVERY DAY 09/15/20   Antonette Angeline ORN, NP  atorvastatin  (LIPITOR) 10 MG tablet Take 0.5 tablets (5 mg total) by mouth daily. Patient taking differently: Take 10 mg by mouth daily. 04/15/20   Antonette Angeline ORN, NP  Butalbital -APAP-Caffeine  50-300-40  MG CAPS Take 1 tablet by mouth daily as needed. 01/17/15   Antonette Angeline ORN, NP  Cholecalciferol (VITAMIN D3) 125 MCG (5000 UT) CAPS Take by mouth.    [provider]  etonogestrel (NEXPLANON) 68 MG IMPL implant 1 each by Subdermal route once. Inserted 10/08/2014    [provider]  fexofenadine (ALLEGRA) 180 MG tablet Take 180 mg by mouth daily. 03/19/24   [provider]  fluticasone  (FLONASE ) 50 MCG/ACT nasal spray Place 1 spray into both nostrils daily as needed for allergies.  07/08/14   [provider]  JARDIANCE 25 MG TABS tablet Take 25 mg by mouth daily. 01/02/24   [provider]  oxymetazoline  (AFRIN)  0.05 % nasal spray Place 2 sprays into both nostrils 2 (two) times daily as needed for congestion.    [provider]  OZEMPIC, 0.25 OR 0.5 MG/DOSE, 2 MG/3ML SOPN Inject 0.5 mg into the skin once a week. 03/17/24   [provider]    Family History Family History  Problem Relation Age of Onset   Hypertension Mother    Arthritis Maternal Aunt    Arthritis Maternal Grandmother    Cancer Maternal Grandmother        Breast   Hypertension Maternal Grandmother    Colon cancer Neg Hx    Colon polyps Neg Hx    Esophageal cancer Neg Hx    Rectal cancer Neg Hx    Stomach cancer Neg Hx     Social History Social History   Tobacco Use   Smoking status: Never   Smokeless tobacco: Never  Vaping Use   Vaping status: Never Used  Substance Use Topics   Alcohol use: Yes    Alcohol/week: 1.0 standard drink of alcohol    Types: 1 Glasses of wine per week    Comment: occasional wine   Drug use: No     Allergies   Latex and Prednisone   Review of Systems Review of Systems  Constitutional:  Negative for appetite change, chills and fever.  HENT:  Positive for congestion. Negative for ear pain, rhinorrhea, sinus pressure, sinus pain and sore throat.        Jaw pain  Eyes:  Negative for redness and visual disturbance.  Respiratory:  Negative for cough, chest tightness, shortness of breath and wheezing.   Cardiovascular:  Negative for chest pain and palpitations.  Gastrointestinal:  Negative for abdominal pain, constipation, diarrhea, nausea and vomiting.  Genitourinary:  Negative for dysuria, frequency and urgency.  Musculoskeletal:  Negative for myalgias.  Neurological:  Negative for dizziness, weakness and headaches.  Psychiatric/Behavioral:  Negative for confusion.   All other systems reviewed and are negative.    Physical Exam Triage Vital Signs ED Triage Vitals  Encounter Vitals Group     BP      Girls Systolic BP Percentile      Girls Diastolic BP  Percentile      Boys Systolic BP Percentile      Boys Diastolic BP Percentile      Pulse      Resp      Temp      Temp src      SpO2      Weight      Height      Head Circumference      Peak Flow      Pain Score      Pain Loc      Pain Education  Exclude from Growth Chart    No data found.  Updated Vital Signs BP (!) 149/88 (BP Location: Left Arm)   Pulse 75   Temp 97.8 F (36.6 C) (Oral)   Resp 20   SpO2 99%   Visual Acuity Right Eye Distance:   Left Eye Distance:   Bilateral Distance:    Right Eye Near:   Left Eye Near:    Bilateral Near:     Physical Exam Vitals reviewed.  Constitutional:      General: She is not in acute distress.    Appearance: Normal appearance. She is not ill-appearing.  HENT:     Head: Normocephalic and atraumatic.     Comments: Mild crepitus L TMJ with opening and closing mouth No pain elicited.  Minimal to no effusion appreciated over the left TMJ.    Right Ear: Tympanic membrane, ear canal and external ear normal. No tenderness. No middle ear effusion. There is no impacted cerumen. Tympanic membrane is not perforated, erythematous, retracted or bulging.     Left Ear: Tympanic membrane, ear canal and external ear normal. No tenderness.  No middle ear effusion. There is no impacted cerumen. Tympanic membrane is not perforated, erythematous, retracted or bulging.     Nose: Nose normal. No congestion.     Mouth/Throat:     Mouth: Mucous membranes are moist.     Pharynx: Uvula midline. No oropharyngeal exudate or posterior oropharyngeal erythema.     Tonsils: No tonsillar exudate.  Eyes:     Extraocular Movements: Extraocular movements intact.     Pupils: Pupils are equal, round, and reactive to light.  Cardiovascular:     Rate and Rhythm: Normal rate and regular rhythm.     Heart sounds: Normal heart sounds.  Pulmonary:     Effort: Pulmonary effort is normal.     Breath sounds: Normal breath sounds. No decreased breath sounds,  wheezing, rhonchi or rales.  Abdominal:     Palpations: Abdomen is soft.     Tenderness: There is no abdominal tenderness. There is no guarding or rebound.  Lymphadenopathy:     Cervical: No cervical adenopathy.     Right cervical: No superficial, deep or posterior cervical adenopathy.    Left cervical: No superficial, deep or posterior cervical adenopathy.  Skin:    Comments: No rash   Neurological:     General: No focal deficit present.     Mental Status: She is alert and oriented to person, place, and time.  Psychiatric:        Mood and Affect: Mood normal.        Behavior: Behavior normal.        Thought Content: Thought content normal.        Judgment: Judgment normal.      UC Treatments / Results  Labs (all labs ordered are listed, but only abnormal results are displayed) Labs Reviewed - No data to display  EKG   Radiology No results found.  Procedures Procedures (including critical care time)  Medications Ordered in UC Medications - No data to display  Initial Impression / Assessment and Plan / UC Course  I have reviewed the triage vital signs and the nursing notes.  Pertinent labs & imaging results that were available during my care of the patient were reviewed by me and considered in my medical decision making (see chart for details).     Patient is a pleasant 48 y.o. female presenting with left TMJ and postnasal drip. The  patient is afebrile and nontachycardic.  Antipyretic has not been administered today.  Implant contraception.  She does not have a sinus infection or an ear infection.  Did not check a COVID or influenza test due to duration of symptoms.  Unable to prescribe prednisone due to allergy.  Will manage symptoms with cetirizine  and ibuprofen , and she will continue Nasonex at home.  Final Clinical Impressions(s) / UC Diagnoses   Final diagnoses:  Postnasal drip  TMJ tenderness, left     Discharge Instructions      -We are treating  for TMJ and for postnasal drip.  -You do not currently have an ear infection or sinus infection -Take ibuprofen  up to 600 mg 3-4 times daily with food.   -Start the Cetirizine  daily. Continue Nasonex     ED Prescriptions     Medication Sig Dispense Auth. Provider   ibuprofen  (ADVIL ) 600 MG tablet Take 1 tablet (600 mg total) by mouth every 6 (six) hours as needed. 30 tablet Kenna Kirn E, PA-C   cetirizine  (ZYRTEC ) 10 MG tablet Take 1 tablet (10 mg total) by mouth daily. 30 tablet Juanantonio Stolar E, PA-C      PDMP not reviewed this encounter.   Arlyss Leita BRAVO, PA-C 06/23/24 [phone]

## 2024-06-23 NOTE — ED Triage Notes (Signed)
 Patient reports nasal congestion x 1 week. Patient reports swollen lymph nodes on left side of face.  Denies pain at this time. Patient has been taking Alka Seltzer cold plus and nasal spray with mild relief. Last dose of Alka Seltzer was yesterday.

## 2024-06-23 NOTE — Discharge Instructions (Signed)
-  We are treating for TMJ and for postnasal drip.  -You do not currently have an ear infection or sinus infection -Take ibuprofen  up to 600 mg 3-4 times daily with food.   -Start the Cetirizine  daily. Continue Nasonex
